# Patient Record
Sex: Male | Born: 1970 | Race: White | Hispanic: No | Marital: Married | Smoking: Never smoker
Health system: Southern US, Community
[De-identification: ages and names within clinical notes are randomized; demographics above are authoritative.]

## PROBLEM LIST (undated history)

## (undated) DIAGNOSIS — K5792 Diverticulitis of intestine, part unspecified, without perforation or abscess without bleeding: Secondary | ICD-10-CM

## (undated) DIAGNOSIS — K429 Umbilical hernia without obstruction or gangrene: Secondary | ICD-10-CM

## (undated) DIAGNOSIS — J302 Other seasonal allergic rhinitis: Secondary | ICD-10-CM

## (undated) DIAGNOSIS — J45909 Unspecified asthma, uncomplicated: Secondary | ICD-10-CM

## (undated) DIAGNOSIS — K469 Unspecified abdominal hernia without obstruction or gangrene: Secondary | ICD-10-CM

## (undated) DIAGNOSIS — I10 Essential (primary) hypertension: Secondary | ICD-10-CM

## (undated) DIAGNOSIS — K5790 Diverticulosis of intestine, part unspecified, without perforation or abscess without bleeding: Secondary | ICD-10-CM

## (undated) HISTORY — DX: Umbilical hernia without obstruction or gangrene: K42.9

## (undated) HISTORY — PX: HERNIA REPAIR: SHX51

## (undated) HISTORY — PX: KNEE ARTHROSCOPY: SUR90

## (undated) HISTORY — DX: Unspecified asthma, uncomplicated: J45.909

## (undated) HISTORY — PX: BACK SURGERY: SHX140

## (undated) HISTORY — DX: Diverticulitis of intestine, part unspecified, without perforation or abscess without bleeding: K57.92

## (undated) HISTORY — PX: KNEE SURGERY: SHX244

## (undated) HISTORY — DX: Unspecified abdominal hernia without obstruction or gangrene: K46.9

## (undated) HISTORY — DX: Diverticulosis of intestine, part unspecified, without perforation or abscess without bleeding: K57.90

---

## 1996-03-22 HISTORY — PX: FINGER AMPUTATION: SHX636

## 2013-08-27 ENCOUNTER — Encounter (INDEPENDENT_AMBULATORY_CARE_PROVIDER_SITE_OTHER): Payer: Self-pay | Admitting: Surgery

## 2013-08-27 ENCOUNTER — Ambulatory Visit (INDEPENDENT_AMBULATORY_CARE_PROVIDER_SITE_OTHER): Payer: Federal, State, Local not specified - PPO | Admitting: Surgery

## 2013-08-27 VITALS — BP 122/80 | HR 47 | Temp 98.4°F | Resp 12 | Ht 70.0 in | Wt 216.0 lb

## 2013-08-27 DIAGNOSIS — M6200 Separation of muscle (nontraumatic), unspecified site: Secondary | ICD-10-CM

## 2013-08-27 DIAGNOSIS — M6208 Separation of muscle (nontraumatic), other site: Secondary | ICD-10-CM | POA: Insufficient documentation

## 2013-08-27 DIAGNOSIS — K439 Ventral hernia without obstruction or gangrene: Secondary | ICD-10-CM

## 2013-08-27 NOTE — Progress Notes (Signed)
General Surgery [facility]  Chief Complaint  Patient presents with  . New Evaluation    evaluate umbilical hernia - referral from Dr. Juline Patch    HISTORY: Patient is a 43 year old male referred by his primary care physician for evaluation of ventral hernia. The patient and his wife noted this is been present for several years. It has gradually increased in size. A causes intermittent discomfort. It has always been reducible. Patient notes at least two areas that bulge and are reducible.    Patient has had no previous abdominal surgery with the exception of a left inguinal hernia repair performed as a child.  Past Medical History  Diagnosis Date  . Asthma     Current Outpatient Prescriptions  Medication Sig Dispense Refill  . DYMISTA 137-50 MCG/ACT SUSP       . fexofenadine (ALLEGRA) 180 MG tablet Take 180 mg by mouth daily.       No current facility-administered medications for this visit.    Allergies  Allergen Reactions  . Relafen [Nabumetone]     Family History  Problem Relation Age of Onset  . Cancer Mother     History   Social History  . Marital Status: Married    Spouse Name: N/A    Number of Children: N/A  . Years of Education: N/A   Social History Main Topics  . Smoking status: Never Smoker   . Smokeless tobacco: None  . Alcohol Use: Yes  . Drug Use: No  . Sexual Activity: None   Other Topics Concern  . None   Social History Narrative  . None    REVIEW OF SYSTEMS - PERTINENT POSITIVES ONLY: Denies signs or symptoms of obstruction. Intermittent discomfort. Always reducible.  EXAM: Filed Vitals:   08/27/13 0901  BP: 122/80  Pulse: 47  Temp: 98.4 F (36.9 C)  Resp: 12    GENERAL: well-developed, well-nourished, no acute distress HEENT: normocephalic; pupils equal and reactive; sclerae clear; dentition good; mucous membranes moist NECK:  symmetric on extension; no palpable anterior or posterior cervical  lymphadenopathy; no supraclavicular masses; no tenderness CHEST: clear to auscultation bilaterally without rales, rhonchi, or wheezes CARDIAC: regular rate and rhythm without significant murmur; peripheral pulses are full ABDOMEN: soft without distension; bowel sounds present; no mass; no hepatosplenomegaly; in a recumbent position there is a palpable fascial defect approximately 5-6 cm above the level of the umbilicus in the midline with a diameter of 3 cm; in a standing position there are 2 discrete fascial defects with the larger being the one described above and a smaller defect more cephalad and slightly to the right of midline; there is a rectus diastases of moderate size present. EXT:  non-tender without edema; no deformity NEURO: no gross focal deficits; no sign of tremor   LABORATORY RESULTS: See [facility] (CHL-Epic) for most recent results  RADIOLOGY RESULTS: See [facility] (CHL-Epic) for most recent results  IMPRESSION: #1 ventral herniae, multiple #2 rectus diastasis, moderate  PLAN: I had a discussion with the patient and his wife regarding these findings. We discussed the options for care, being open ventral hernia repair with mesh and concurrent repair of his rectus diastases versus performing laparoscopic repair of his ventral hernia without repair of the rectus diastases. We discussed the pros and cons of each procedure. We discussed the hospital stay and the postoperative recovery to be expected. I have recommended laparoscopic repair. The patient and his wife agree and wish to proceed.  The  risks and benefits of the procedure have been discussed at length with the patient.  The patient understands the proposed procedure, potential alternative treatments, and the course of recovery to be expected.  All of the patient's questions have been answered at this time.  The patient wishes to proceed with surgery.  Velora Hecklerodd M. Kaylan Friedmann, MD, FACS General & Endocrine  [facility]  Primary Care Physician: Juline PatchPANG,RICHARD, MD

## 2013-08-27 NOTE — Patient Instructions (Signed)
Laparoscopic Ventral Hernia Repair Laparoscopic ventral hernia repairis a surgery to fix a ventral hernia. Aventral hernia, also called an incisional hernia, is a bulge of body tissue or intestines that pushes through the front part of the abdomen. This can happen if the connective tissue covering the muscles over the abdomen has a weak spot or is torn because of a surgical cut (incision) from a previous surgery. Laparoscopic ventral hernia repair is often done soon after diagnosis to stop the hernia from getting bigger, becoming uncomfortable, or becoming an emergency. This surgery usually takes about 2 hours, but the time can vary greatly. LET YOUR CAREGIVER KNOW ABOUT:  Any allergies you have.  All medicines you are taking, including steroids, vitamins, herbs, eyedrops, and over-the-counter medicines and creams.  Previous problems you or members of your family have had with the use of anesthetics.  Any blood disorders or bleeding problems you have had.  Past surgeries you have had.  Other health problems you have. RISKS AND COMPLICATIONS  Generally, laparoscopic ventral hernia repair is a safe procedure. However, as with any surgical procedure, complications can occur. Possible complications include:  Bleeding.  Trouble passing urine or having a bowel movement after the operation.  Infection.  Pneumonia.  Blood clots.  Pain in the area of the hernia.  A bulge in the area of the hernia that may be caused by a collection of fluid.  Injury to intestines or other structures in the abdomen.  Return of the hernia after surgery. In some cases, the caregiver may need to stop the laparoscopic procedure and do regular, open surgery. This may be necessary for very difficult hernias, when organs are hard to see, or when bleeding problems occur during surgery. BEFORE THE PROCEDURE   You may need to have blood tests, urine tests, a chest X-ray, or electrocardiography done before the day  of the surgery.  Ask your caregiver about changing or stopping your regular medicines.  You may need to wash with a special type of germ-killing soap.  Do not eat or drink anything for at least 6 hours before the surgery.  Make plans to have someone drive you home after the procedure. PROCEDURE   Small monitors will be put on your body. They are used to check your heart, blood pressure, and oxygen level.  An intravenous (IV) access tube will be put into a vein in your hand or arm. Fluids and medicine will flow directly into your body through the IV tube.  You will be given medicine to make you sleep through the procedure (general anesthetic).  Your abdomen will be cleaned with a special soap to kill any germs on your skin.  Once you are asleep, several small incisions will be made in your abdomen.  The large space in your abdomen will be filled with air so that it expands. This gives the caregiver more room and a better view.  A thin, lighted tube with a tiny camera on the end (laparoscope) is put through a small incision in your abdomen. The camera on the laparoscope sends a picture to a TV screen in the operating room. This gives the caregiver a good view inside the abdomen.  Hollow tubes are put through the other small incisions in your abdomen. The tools needed for the procedure are put through these tubes.  The caregiver puts the tissue or intestines that formed the hernia back in place.  A screen-like patch (mesh) is used to close the hernia. This helps make   the area stronger. Stitches, tacks, or staples are used to keep the mesh in place.  Medicine and a bandage (dressing) or skin glue will be put over the incisions. AFTER THE PROCEDURE   You will stay in a recovery area until the anesthetic wears off. Your blood pressure and pulse will be checked often.  You may be able to go home the same day or may need to stay in the hospital for 1 or 2 days after this surgery. Your  caregiver will decide when you can go home.  You may feel some pain. You will likely be given medicine for pain.  You will be urged to do breathing exercises that involve taking deep breaths. This helps prevent a lung infection after a surgery.  You may have to wear compression stockings while you are in the hospital. These stockings help keep blood clots from forming in your legs. Document Released: 02/23/2012 Document Reviewed: 02/23/2012 Exit[employer] Patient Information 2014 [employer].  

## 2013-08-28 ENCOUNTER — Encounter (HOSPITAL_COMMUNITY): Payer: Self-pay | Admitting: Pharmacy Technician

## 2013-08-29 ENCOUNTER — Encounter (HOSPITAL_COMMUNITY): Payer: Self-pay

## 2013-08-29 ENCOUNTER — Encounter (HOSPITAL_COMMUNITY)
Admission: RE | Admit: 2013-08-29 | Discharge: 2013-08-29 | Disposition: A | Discharge status: Home / Self Care | Payer: Federal, State, Local not specified - PPO | Source: Ambulatory Visit | Attending: Surgery | Admitting: Surgery

## 2013-08-29 HISTORY — DX: Other seasonal allergic rhinitis: J30.2

## 2013-08-29 LAB — CBC
HCT: 45.7 % (ref 39.0–52.0)
Hemoglobin: 16 g/dL (ref 13.0–17.0)
MCH: 30.8 pg (ref 26.0–34.0)
MCHC: 35 g/dL (ref 30.0–36.0)
MCV: 88.1 fL (ref 78.0–100.0)
PLATELETS: 232 10*3/uL (ref 150–400)
RBC: 5.19 MIL/uL (ref 4.22–5.81)
RDW: 12.9 % (ref 11.5–15.5)
WBC: 9.9 10*3/uL (ref 4.0–10.5)

## 2013-08-29 NOTE — Patient Instructions (Signed)
Your procedure is scheduled on:  08/31/13  FRIDAY  Report to [facility]-- MAIN ENTRANCE- FOLLOW SIGNS TO SHORT STAY CENTER Short Stay Center at 0530      AM.   Call this number if you have problems the morning of surgery: [phone]        Do not eat food  Or drink :After Midnight. Thursday NIGHT   Take these medicines the morning of surgery with A SIP OF WATER:NONE   .  Contacts, dentures or partial plates, or metal hairpins  can not be worn to surgery. Your family will be responsible for glasses, dentures, hearing aides while you are in surgery  Leave suitcase in the car. After surgery it may be brought to your room.  For patients admitted to the hospital, checkout time is 11:00 AM day of  discharge.         [facility] IS NOT RESPONSIBLE FOR ANY VALUABLES                                                                 [place] - Preparing for Surgery Before surgery, you can play an important role.  Because skin is not sterile, your skin needs to be as free of germs as possible.  You can reduce the number of germs on your skin by washing with CHG (chlorahexidine gluconate) soap before surgery.  CHG is an antiseptic cleaner which kills germs and bonds with the skin to continue killing germs even after washing. Please DO NOT use if you have an allergy to CHG or antibacterial soaps.  If your skin becomes reddened/irritated stop using the CHG and inform your nurse when you arrive at Short Stay. Do not shave (including legs and underarms) for at least 48 hours prior to the first CHG shower.  You may shave your face/neck. Please follow these instructions carefully:  1.  Shower with CHG Soap the night before surgery and the  morning of Surgery.  2.  If you choose to wash your hair, wash your hair first as usual with your  normal  shampoo.  3.  After you shampoo, rinse your hair and body thoroughly to remove the  shampoo.                           4.  Use CHG as you would any  other liquid soap.  You can apply chg directly  to the skin and wash                       Gently with a scrungie or clean washcloth.  5.  Apply the CHG Soap to your body ONLY FROM THE NECK DOWN.   Do not use on face/ open                           Wound or open sores. Avoid contact with eyes, ears mouth and genitals (private parts).                       Wash face,  Genitals (private parts) with your normal soap.  6.  Wash thoroughly, paying special attention to the area where your surgery  will be performed.  7.  Thoroughly rinse your body with warm water from the neck down.  8.  DO NOT shower/wash with your normal soap after using and rinsing off  the CHG Soap.                9.  Pat yourself dry with a clean towel.            10.  Wear clean pajamas.            11.  Place clean sheets on your bed the night of your first shower and do not  sleep with pets. Day of Surgery : Do not apply any lotions/deodorants the morning of surgery.  Please wear clean clothes to the hospital/surgery center.  FAILURE TO FOLLOW THESE INSTRUCTIONS MAY RESULT IN THE CANCELLATION OF YOUR SURGERY PATIENT SIGNATURE_________________________________  NURSE SIGNATURE__________________________________  ________________________________________________________________________

## 2013-08-30 NOTE — Progress Notes (Signed)
Quick Note:  These results are acceptable for scheduled surgery.  Jesusmanuel Erbes M. Bryanah Sidell, MD, FACS [facility] Office: [phone]   ______ 

## 2013-08-31 ENCOUNTER — Encounter (HOSPITAL_COMMUNITY): Payer: Federal, State, Local not specified - PPO | Admitting: *Deleted

## 2013-08-31 ENCOUNTER — Encounter (HOSPITAL_COMMUNITY): Payer: Self-pay | Admitting: *Deleted

## 2013-08-31 ENCOUNTER — Observation Stay (HOSPITAL_COMMUNITY)
Admission: RE | Admit: 2013-08-31 | Discharge: 2013-09-01 | Disposition: A | Discharge status: Home / Self Care | Payer: Federal, State, Local not specified - PPO | Source: Ambulatory Visit | Attending: Surgery | Admitting: Surgery

## 2013-08-31 ENCOUNTER — Encounter (HOSPITAL_COMMUNITY)
Admission: RE | Disposition: A | Discharge status: Home / Self Care | Payer: Self-pay | Source: Ambulatory Visit | Attending: Surgery

## 2013-08-31 ENCOUNTER — Ambulatory Visit (HOSPITAL_COMMUNITY): Payer: Federal, State, Local not specified - PPO | Admitting: *Deleted

## 2013-08-31 DIAGNOSIS — K439 Ventral hernia without obstruction or gangrene: Principal | ICD-10-CM | POA: Insufficient documentation

## 2013-08-31 DIAGNOSIS — M6208 Separation of muscle (nontraumatic), other site: Secondary | ICD-10-CM | POA: Diagnosis present

## 2013-08-31 DIAGNOSIS — M6200 Separation of muscle (nontraumatic), unspecified site: Secondary | ICD-10-CM | POA: Insufficient documentation

## 2013-08-31 DIAGNOSIS — Z79899 Other long term (current) drug therapy: Secondary | ICD-10-CM | POA: Insufficient documentation

## 2013-08-31 DIAGNOSIS — Z01812 Encounter for preprocedural laboratory examination: Secondary | ICD-10-CM | POA: Insufficient documentation

## 2013-08-31 DIAGNOSIS — J45909 Unspecified asthma, uncomplicated: Secondary | ICD-10-CM | POA: Insufficient documentation

## 2013-08-31 HISTORY — PX: VENTRAL HERNIA REPAIR: SHX424

## 2013-08-31 HISTORY — PX: INSERTION OF MESH: SHX5868

## 2013-08-31 SURGERY — REPAIR, HERNIA, VENTRAL, LAPAROSCOPIC
Anesthesia: General | Site: Abdomen

## 2013-08-31 MED ORDER — 0.9 % SODIUM CHLORIDE (POUR BTL) OPTIME
TOPICAL | Status: DC | PRN
  Administered 2013-08-31: 1000 mL

## 2013-08-31 MED ORDER — MIDAZOLAM HCL 2 MG/2ML IJ SOLN
INTRAMUSCULAR | Status: AC
  Filled 2013-08-31: qty 2

## 2013-08-31 MED ORDER — CISATRACURIUM BESYLATE 20 MG/10ML IV SOLN
INTRAVENOUS | Status: AC
  Filled 2013-08-31: qty 10

## 2013-08-31 MED ORDER — DEXAMETHASONE SODIUM PHOSPHATE 10 MG/ML IJ SOLN
INTRAMUSCULAR | Status: AC
Start: 2013-08-31 — End: 2013-08-31
  Filled 2013-08-31: qty 1

## 2013-08-31 MED ORDER — PROPOFOL 10 MG/ML IV BOLUS
INTRAVENOUS | Status: DC | PRN
  Administered 2013-08-31: 200 mg via INTRAVENOUS

## 2013-08-31 MED ORDER — ACETAMINOPHEN 325 MG PO TABS: 650.0000 mg | ORAL_TABLET | ORAL | Status: DC | PRN

## 2013-08-31 MED ORDER — FENTANYL CITRATE 0.05 MG/ML IJ SOLN
25.0000 ug | INTRAMUSCULAR | Status: DC | PRN
  Administered 2013-08-31 (×2): 50 ug via INTRAVENOUS

## 2013-08-31 MED ORDER — ONDANSETRON HCL 4 MG/2ML IJ SOLN: 4.0000 mg | Freq: Four times a day (QID) | INTRAMUSCULAR | Status: DC | PRN

## 2013-08-31 MED ORDER — BUPIVACAINE-EPINEPHRINE 0.25% -1:200000 IJ SOLN
INTRAMUSCULAR | Status: DC | PRN
  Administered 2013-08-31: 20 mL

## 2013-08-31 MED ORDER — BUPIVACAINE-EPINEPHRINE (PF) 0.25% -1:200000 IJ SOLN
INTRAMUSCULAR | Status: AC
  Filled 2013-08-31: qty 30

## 2013-08-31 MED ORDER — KETAMINE HCL 10 MG/ML IJ SOLN
INTRAMUSCULAR | Status: AC
  Filled 2013-08-31: qty 1

## 2013-08-31 MED ORDER — HYDROMORPHONE HCL PF 1 MG/ML IJ SOLN
1.0000 mg | INTRAMUSCULAR | Status: DC | PRN
  Administered 2013-08-31 (×5): 1 mg via INTRAVENOUS
  Filled 2013-08-31 (×5): qty 1

## 2013-08-31 MED ORDER — ONDANSETRON HCL 4 MG/2ML IJ SOLN
INTRAMUSCULAR | Status: DC | PRN
Start: 2013-08-31 — End: 2013-08-31
  Administered 2013-08-31: 4 mg via INTRAVENOUS

## 2013-08-31 MED ORDER — PROMETHAZINE HCL 25 MG/ML IJ SOLN: 6.2500 mg | INTRAMUSCULAR | Status: DC | PRN

## 2013-08-31 MED ORDER — GLYCOPYRROLATE 0.2 MG/ML IJ SOLN
INTRAMUSCULAR | Status: AC
  Filled 2013-08-31: qty 3

## 2013-08-31 MED ORDER — CEFAZOLIN SODIUM-DEXTROSE 2-3 GM-% IV SOLR
INTRAVENOUS | Status: AC
  Filled 2013-08-31: qty 50

## 2013-08-31 MED ORDER — NEOSTIGMINE METHYLSULFATE 10 MG/10ML IV SOLN
INTRAVENOUS | Status: DC | PRN
  Administered 2013-08-31: 4 mg via INTRAVENOUS

## 2013-08-31 MED ORDER — CISATRACURIUM BESYLATE (PF) 10 MG/5ML IV SOLN
INTRAVENOUS | Status: DC | PRN
  Administered 2013-08-31: 6 mg via INTRAVENOUS

## 2013-08-31 MED ORDER — GLYCOPYRROLATE 0.2 MG/ML IJ SOLN
INTRAMUSCULAR | Status: AC
  Filled 2013-08-31: qty 1

## 2013-08-31 MED ORDER — KCL IN DEXTROSE-NACL 20-5-0.45 MEQ/L-%-% IV SOLN
INTRAVENOUS | Status: DC
  Administered 2013-08-31 (×2): via INTRAVENOUS
  Filled 2013-08-31 (×3): qty 1000

## 2013-08-31 MED ORDER — LACTATED RINGERS IV SOLN: INTRAVENOUS | Status: DC

## 2013-08-31 MED ORDER — ONDANSETRON HCL 4 MG/2ML IJ SOLN
INTRAMUSCULAR | Status: AC
  Filled 2013-08-31: qty 2

## 2013-08-31 MED ORDER — KCL IN DEXTROSE-NACL 20-5-0.45 MEQ/L-%-% IV SOLN
INTRAVENOUS | Status: AC
  Filled 2013-08-31: qty 1000

## 2013-08-31 MED ORDER — FENTANYL CITRATE 0.05 MG/ML IJ SOLN
INTRAMUSCULAR | Status: DC | PRN
  Administered 2013-08-31: 50 ug via INTRAVENOUS
  Administered 2013-08-31: 25 ug via INTRAVENOUS
  Administered 2013-08-31 (×5): 50 ug via INTRAVENOUS
  Administered 2013-08-31: 25 ug via INTRAVENOUS

## 2013-08-31 MED ORDER — PROPOFOL 10 MG/ML IV BOLUS
INTRAVENOUS | Status: AC
  Filled 2013-08-31: qty 20

## 2013-08-31 MED ORDER — LACTATED RINGERS IR SOLN
Status: DC | PRN
  Administered 2013-08-31: 1

## 2013-08-31 MED ORDER — METOCLOPRAMIDE HCL 5 MG/ML IJ SOLN
INTRAMUSCULAR | Status: AC
  Filled 2013-08-31: qty 2

## 2013-08-31 MED ORDER — NEOSTIGMINE METHYLSULFATE 10 MG/10ML IV SOLN
INTRAVENOUS | Status: AC
  Filled 2013-08-31: qty 1

## 2013-08-31 MED ORDER — SUCCINYLCHOLINE CHLORIDE 20 MG/ML IJ SOLN
INTRAMUSCULAR | Status: DC | PRN
  Administered 2013-08-31: 100 mg via INTRAVENOUS

## 2013-08-31 MED ORDER — ONDANSETRON HCL 4 MG PO TABS: 4.0000 mg | ORAL_TABLET | Freq: Four times a day (QID) | ORAL | Status: DC | PRN

## 2013-08-31 MED ORDER — HYDROMORPHONE HCL PF 2 MG/ML IJ SOLN
INTRAMUSCULAR | Status: AC
  Filled 2013-08-31: qty 1

## 2013-08-31 MED ORDER — EPHEDRINE SULFATE 50 MG/ML IJ SOLN
INTRAMUSCULAR | Status: DC | PRN
  Administered 2013-08-31 (×2): 5 mg via INTRAVENOUS

## 2013-08-31 MED ORDER — EPHEDRINE SULFATE 50 MG/ML IJ SOLN
INTRAMUSCULAR | Status: AC
  Filled 2013-08-31: qty 1

## 2013-08-31 MED ORDER — HYDROCODONE-ACETAMINOPHEN 5-325 MG PO TABS
1.0000 | ORAL_TABLET | ORAL | Status: DC | PRN
  Administered 2013-08-31 – 2013-09-01 (×3): 2 via ORAL
  Filled 2013-08-31 (×3): qty 2

## 2013-08-31 MED ORDER — LACTATED RINGERS IV SOLN
INTRAVENOUS | Status: DC | PRN
  Administered 2013-08-31 (×2): via INTRAVENOUS

## 2013-08-31 MED ORDER — CEFAZOLIN SODIUM-DEXTROSE 2-3 GM-% IV SOLR
2.0000 g | INTRAVENOUS | Status: AC
  Administered 2013-08-31: 2 g via INTRAVENOUS

## 2013-08-31 MED ORDER — MIDAZOLAM HCL 5 MG/5ML IJ SOLN
INTRAMUSCULAR | Status: DC | PRN
  Administered 2013-08-31: 2 mg via INTRAVENOUS

## 2013-08-31 MED ORDER — KETAMINE HCL 10 MG/ML IJ SOLN
INTRAMUSCULAR | Status: DC | PRN
  Administered 2013-08-31: 50 mg via INTRAVENOUS

## 2013-08-31 MED ORDER — FENTANYL CITRATE 0.05 MG/ML IJ SOLN
INTRAMUSCULAR | Status: AC
  Filled 2013-08-31: qty 5

## 2013-08-31 MED ORDER — GLYCOPYRROLATE 0.2 MG/ML IJ SOLN
INTRAMUSCULAR | Status: DC | PRN
  Administered 2013-08-31: .5 mg via INTRAVENOUS
  Administered 2013-08-31: 0.1 mg via INTRAVENOUS

## 2013-08-31 MED ORDER — MEPERIDINE HCL 50 MG/ML IJ SOLN: 6.2500 mg | INTRAMUSCULAR | Status: DC | PRN

## 2013-08-31 MED ORDER — FENTANYL CITRATE 0.05 MG/ML IJ SOLN
INTRAMUSCULAR | Status: AC
  Filled 2013-08-31: qty 2

## 2013-08-31 MED ORDER — METOCLOPRAMIDE HCL 5 MG/ML IJ SOLN
INTRAMUSCULAR | Status: DC | PRN
  Administered 2013-08-31: 10 mg via INTRAVENOUS

## 2013-08-31 SURGICAL SUPPLY — 38 items
BENZOIN TINCTURE PRP APPL 2/3 (GAUZE/BANDAGES/DRESSINGS) ×2 IMPLANT
BINDER ABDOMINAL 12 ML 46-62 (SOFTGOODS) ×2 IMPLANT
CANISTER SUCTION 2500CC (MISCELLANEOUS) IMPLANT
CHLORAPREP W/TINT 26ML (MISCELLANEOUS) ×2 IMPLANT
CLOSURE STERI-STRIP 1/4X4 (GAUZE/BANDAGES/DRESSINGS) ×2 IMPLANT
DECANTER SPIKE VIAL GLASS SM (MISCELLANEOUS) IMPLANT
DEVICE SECURE STRAP 25 ABSORB (INSTRUMENTS) ×4 IMPLANT
DEVICE TROCAR PUNCTURE CLOSURE (ENDOMECHANICALS) ×2 IMPLANT
DRAPE INCISE IOBAN 66X45 STRL (DRAPES) IMPLANT
DRAPE LAPAROSCOPIC ABDOMINAL (DRAPES) ×2 IMPLANT
DRAPE UTILITY XL STRL (DRAPES) ×2 IMPLANT
ELECT REM PT RETURN 9FT ADLT (ELECTROSURGICAL) ×2
ELECTRODE REM PT RTRN 9FT ADLT (ELECTROSURGICAL) ×1 IMPLANT
GAUZE SPONGE 2X2 8PLY STRL LF (GAUZE/BANDAGES/DRESSINGS) ×1 IMPLANT
GLOVE SURG ORTHO 8.0 STRL STRW (GLOVE) ×2 IMPLANT
GOWN STRL REUS W/TWL XL LVL3 (GOWN DISPOSABLE) ×4 IMPLANT
KIT BASIN OR (CUSTOM PROCEDURE TRAY) ×2 IMPLANT
MARKER SKIN DUAL TIP RULER LAB (MISCELLANEOUS) ×2 IMPLANT
MESH VENTRALIGHT ST 6X8 (Mesh Specialty) ×1 IMPLANT
MESH VENTRLGHT ELLIPSE 8X6XMFL (Mesh Specialty) ×1 IMPLANT
NEEDLE SPNL 22GX3.5 QUINCKE BK (NEEDLE) ×2 IMPLANT
SCISSORS LAP 5X35 DISP (ENDOMECHANICALS) IMPLANT
SET IRRIG TUBING LAPAROSCOPIC (IRRIGATION / IRRIGATOR) IMPLANT
SHEARS HARMONIC ACE PLUS 36CM (ENDOMECHANICALS) IMPLANT
SLEEVE XCEL OPT CAN 5 100 (ENDOMECHANICALS) ×8 IMPLANT
SOLUTION ANTI FOG 6CC (MISCELLANEOUS) ×2 IMPLANT
SPONGE GAUZE 2X2 STER 10/PKG (GAUZE/BANDAGES/DRESSINGS) ×1
STRIP CLOSURE SKIN 1/2X4 (GAUZE/BANDAGES/DRESSINGS) ×4 IMPLANT
SUT NOVA NAB DX-16 0-1 5-0 T12 (SUTURE) ×4 IMPLANT
TACKER 5MM HERNIA 3.5CML NAB (ENDOMECHANICALS) IMPLANT
TAPE CLOTH SOFT 2X10 (GAUZE/BANDAGES/DRESSINGS) ×2 IMPLANT
TOWEL OR 17X26 10 PK STRL BLUE (TOWEL DISPOSABLE) ×2 IMPLANT
TOWEL OR NON WOVEN STRL DISP B (DISPOSABLE) ×2 IMPLANT
TRAY FOLEY CATH 14FRSI W/METER (CATHETERS) ×2 IMPLANT
TRAY LAP CHOLE (CUSTOM PROCEDURE TRAY) ×2 IMPLANT
TROCAR XCEL NON-BLD 11X100MML (ENDOMECHANICALS) ×2 IMPLANT
TROCAR XCEL UNIV SLVE 11M 100M (ENDOMECHANICALS) ×2 IMPLANT
TUBING INSUFFLATION 10FT LAP (TUBING) ×2 IMPLANT

## 2013-08-31 NOTE — Anesthesia Preprocedure Evaluation (Addendum)
Anesthesia Evaluation  Patient identified by MRN, date of birth, ID band Patient awake    Reviewed: Allergy & Precautions, H&P , NPO status , Patient's Chart, lab work & pertinent test results  Airway Mallampati: II TM Distance: >3 FB Neck ROM: full    Dental no notable dental hx.    Pulmonary neg pulmonary ROS,  breath sounds clear to auscultation  Pulmonary exam normal       Cardiovascular Exercise Tolerance: Good negative cardio ROS  Rhythm:regular Rate:Normal     Neuro/Psych negative neurological ROS  negative psych ROS   GI/Hepatic negative GI ROS, Neg liver ROS,   Endo/Other  negative endocrine ROS  Renal/GU negative Renal ROS  negative genitourinary   Musculoskeletal   Abdominal   Peds  Hematology negative hematology ROS (+)   Anesthesia Other Findings   Reproductive/Obstetrics negative OB ROS                           Anesthesia Physical Anesthesia Plan  ASA: I  Anesthesia Plan: General   Post-op Pain Management:    Induction:   Airway Management Planned: Oral ETT and LMA  Additional Equipment:   Intra-op Plan:   Post-operative Plan:   Informed Consent: I have reviewed the patients History and Physical, chart, labs and discussed the procedure including the risks, benefits and alternatives for the proposed anesthesia with the patient or authorized representative who has indicated his/her understanding and acceptance.   Dental Advisory Given  Plan Discussed with: CRNA  Anesthesia Plan Comments:        Anesthesia Quick Evaluation

## 2013-08-31 NOTE — Op Note (Signed)
NAMPilar Grammes:  Mccuistion, Buren               ACCOUNT NO.:  [id number]  MEDICAL RECORD NO.:  [id number]  LOCATION:  [facility]                         [facility]  PHYSICIAN:  Velora Hecklerodd M Jerri Glauser, MD      DATE OF BIRTH:  14-Jun-1970  DATE OF PROCEDURE:  08/31/2013                               OPERATIVE REPORT   PREOPERATIVE DIAGNOSIS:  Ventral hernia.  POSTOPERATIVE DIAGNOSIS:  Ventral hernia.  PROCEDURE:  Laparoscopic ventral hernia repair with mesh (15 cm x 20 cm Ventralex).  SURGEON:  Velora Hecklerodd M. Pedro Whiters, MD, FACS  ANESTHESIA:  General.  ESTIMATED BLOOD LOSS:  Minimal.  PREPARATION:  ChloraPrep.  COMPLICATIONS:  None.  INDICATIONS:  The patient is a 43 year old male referred by Dr. Juline Patchichard Pang for evaluation of ventral hernia.  The patient and his wife had noted the hernia for several years.  It had gradually increased in size. It causes intermittent discomfort.  It has always been reducible.  There are at least 2 fascial defects identified on physical exam.  The patient now comes to Surgery for laparoscopic repair.  BODY OF REPORT:  [facility]  The patient was brought to the operating room, placed in supine position on the operating room table.  Following administration of general anesthesia, the patient was positioned and then prepped and draped in the usual aseptic fashion.  After ascertaining that an adequate level of anesthesia had been achieved, an incision was made at the left costal margin in the left upper quadrant of the abdominal wall.  Using a 5-mm Optiview trocar and the laparoscoped the peritoneal cavity was accessed and then insufflated with carbon dioxide.  An additional operative port was placed in the left lower quadrant with 11-mm trocar.  There were no significant adhesions to the anterior abdominal wall.  The falciform ligament extends to nearly one of the hernia defects.  It was taken down using the  electrocautery and sharp dissection.  The area around each hernia defect was cleared sharply and hemostasis achieved with the electrocautery.  The initial largest defect was approximately 3 cm in diameter, and was located in the midline approximately 6 cm above the umbilicus.  There was a second 1.5 cm defect also merely in the midline approximately 4 cm cephalad to the first larger defect.  Measurements were taken, and it appeared that a 20 cm x 15 cm mesh will cover both defects with a broad 5-cm overlap.  Inspection of the remaining midline showed no other hernia defects, particularly around the area of the umbilicus.  Mesh was prepared with interrupted #1 Novafil sutures placed circumferentially at 8 equally spaced points around the mesh.  Mesh was then moistened, rolled, and inserted under direct vision into the peritoneal cavity.  It was appropriately oriented.  Two additional 5-mm operative ports were placed in the right upper quadrant and left lower quadrant.  Using the EndoCatch, the suture tags were retrieved through the abdominal wall.  The sutures were then pulled taut to elevate the mesh against the abdominal wall with clearly wide overlap of the hernia defects.  Each of the #1 Novafil sutures was then tied securely.  Using the SecureStrap tacking device, the mesh was then affixed to the anterior abdominal wall with a closely spaced row of SecureStraps around the periphery and a second concentric circle placed more centrally on the mesh.  At the conclusion, there was good approximation of the mesh circumferentially and no areas of laxity were identified.  Good hemostasis was noted.  Ports were removed under direct vision. Hemostasis was noted at all port sites.  Pneumoperitoneum was released. Port sites were anesthetized with local anesthetic and closed with interrupted 4-0 Monocryl simple sutures.  All wounds were washed and dried.  Benzoin and Steri-Strips were applied  to all wounds.  Dry gauze dressings were applied to all wounds.  A 12-inch abdominal binder was placed around the torso upon transfer off the operating room table.  The patient was awakened from anesthesia and brought to the recovery room in stable condition.  The patient tolerated the procedure well.   [facility] Hecklerodd M. Ollie Delano, MD, [facility] Office: [phone]    TMG/MEDQ  D:  08/31/2013  T:  08/31/2013  Job:  [id number]  cc:   Juline Patchichard Pang, M.D. Fax: [phone]

## 2013-08-31 NOTE — Anesthesia Postprocedure Evaluation (Signed)
  Anesthesia Post-op Note  Patient: Kenneth Krause  Procedure(s) Performed: Procedure(s) (LRB): LAPAROSCOPIC VENTRAL HERNIA (N/A) INSERTION OF MESH (N/A)  Patient Location: PACU  Anesthesia Type: General  Level of Consciousness: awake and alert   Airway and Oxygen Therapy: Patient Spontanous Breathing  Post-op Pain: mild  Post-op Assessment: Post-op Vital signs reviewed, Patient's Cardiovascular Status Stable, Respiratory Function Stable, Patent Airway and No signs of Nausea or vomiting  Last Vitals:  Filed Vitals:   08/31/13 1330  BP: 127/76  Pulse: 60  Temp: 36.3 C  Resp: 12    Post-op Vital Signs: stable   Complications: No apparent anesthesia complications

## 2013-08-31 NOTE — Interval H&P Note (Signed)
History and Physical Interval Note:  08/31/2013 7:18 AM  Pilar Grammesimothy Labrum  has presented today for surgery, with the diagnosis of ventral hernia.  The various methods of treatment have been discussed with the patient and family. After consideration of risks, benefits and other options for treatment, the patient has consented to    Procedure(s): LAPAROSCOPIC VENTRAL HERNIA (N/A) INSERTION OF MESH (N/A) as a surgical intervention .    The patient's history has been reviewed, patient examined, no change in status, stable for surgery.  I have reviewed the patient's chart and labs.  Questions were answered to the patient's satisfaction.    [facility] Hecklerodd M. Donelle Baba, MD, [facility] Office: [phone]    Timothey Dahlstrom MJudie Petit

## 2013-08-31 NOTE — Brief Op Note (Signed)
08/31/2013  8:58 AM  PATIENT:  Kenneth Krause  43 y.o. male  PRE-OPERATIVE DIAGNOSIS:  ventral hernia  POST-OPERATIVE DIAGNOSIS:  same  PROCEDURE:  Procedure(s): LAPAROSCOPIC VENTRAL HERNIA (N/A) INSERTION OF MESH (N/A)  SURGEON:  Surgeon(s) and Role:    * Vel[facility] Hecklerodd M Nehan Flaum, MD - Primary  ANESTHESIA:   general  EBL:  Total I/O In: -  Out: 150 [Urine:150]  BLOOD ADMINISTERED:none  DRAINS: none   LOCAL MEDICATIONS USED:  MARCAINE     SPECIMEN:  No Specimen  DISPOSITION OF SPECIMEN:  N/A  COUNTS:  YES  TOURNIQUET:  * No tourniquets in log *  DICTATION: .Other Dictation: Dictation Number [id number]  PLAN OF CARE: Admit for overnight observation  PATIENT DISPOSITION:  PACU - hemodynamically stable.   Delay start of Pharmacological VTE agent (>24hrs) due to surgical blood loss or risk of bleeding: yes  Vel[facility] Hecklerodd M. Chibueze Beasley, MD, [facility] Office: [phone]

## 2013-08-31 NOTE — H&P (View-Only) (Signed)
General Surgery - [facility]  Chief Complaint  Patient presents with  . New Evaluation    evaluate umbilical hernia - referral from Dr. Richard Pang    HISTORY: Patient is a 42-year-old male referred by his primary care physician for evaluation of ventral hernia. The patient and his wife noted this is been present for several years. It has gradually increased in size. A causes intermittent discomfort. It has always been reducible. Patient notes at least two areas that bulge and are reducible.    Patient has had no previous abdominal surgery with the exception of a left inguinal hernia repair performed as a child.  Past Medical History  Diagnosis Date  . Asthma     Current Outpatient Prescriptions  Medication Sig Dispense Refill  . DYMISTA 137-50 MCG/ACT SUSP       . fexofenadine (ALLEGRA) 180 MG tablet Take 180 mg by mouth daily.       No current facility-administered medications for this visit.    Allergies  Allergen Reactions  . Relafen [Nabumetone]     Family History  Problem Relation Age of Onset  . Cancer Mother     History   Social History  . Marital Status: Married    Spouse Name: N/A    Number of Children: N/A  . Years of Education: N/A   Social History Main Topics  . Smoking status: Never Smoker   . Smokeless tobacco: None  . Alcohol Use: Yes  . Drug Use: No  . Sexual Activity: None   Other Topics Concern  . None   Social History Narrative  . None    REVIEW OF SYSTEMS - PERTINENT POSITIVES ONLY: Denies signs or symptoms of obstruction. Intermittent discomfort. Always reducible.  EXAM: Filed Vitals:   08/27/13 0901  BP: 122/80  Pulse: 47  Temp: 98.4 F (36.9 C)  Resp: 12    GENERAL: well-developed, well-nourished, no acute distress HEENT: normocephalic; pupils equal and reactive; sclerae clear; dentition good; mucous membranes moist NECK:  symmetric on extension; no palpable anterior or posterior cervical  lymphadenopathy; no supraclavicular masses; no tenderness CHEST: clear to auscultation bilaterally without rales, rhonchi, or wheezes CARDIAC: regular rate and rhythm without significant murmur; peripheral pulses are full ABDOMEN: soft without distension; bowel sounds present; no mass; no hepatosplenomegaly; in a recumbent position there is a palpable fascial defect approximately 5-6 cm above the level of the umbilicus in the midline with a diameter of 3 cm; in a standing position there are 2 discrete fascial defects with the larger being the one described above and a smaller defect more cephalad and slightly to the right of midline; there is a rectus diastases of moderate size present. EXT:  non-tender without edema; no deformity NEURO: no gross focal deficits; no sign of tremor   LABORATORY RESULTS: See [facility] ([facility]L-Epic) for most recent results  RADIOLOGY RESULTS: See [facility] (CHL-Epic) for most recent results  IMPRESSION: #1 ventral herniae, multiple #2 rectus diastasis, moderate  PLAN: I had a discussion with the patient and his wife regarding these findings. We discussed the options for care, being open ventral hernia repair with mesh and concurrent repair of his rectus diastases versus performing laparoscopic repair of his ventral hernia without repair of the rectus diastases. We discussed the pros and cons of each procedure. We discussed the hospital stay and the postoperative recovery to be expected. I have recommended laparoscopic repair. The patient and his wife agree and wish to proceed.  The   risks and benefits of the procedure have been discussed at length with the patient.  The patient understands the proposed procedure, potential alternative treatments, and the course of recovery to be expected.  All of the patient's questions have been answered at this time.  The patient wishes to proceed with surgery.  Velora Hecklerodd M. Demetrias Goodbar, MD, FACS General & Endocrine  [facility]gery [facility]  Primary Care Physician: Juline PatchPANG,RICHARD, MD

## 2013-08-31 NOTE — Transfer of Care (Signed)
Immediate Anesthesia Transfer of Care Note  Patient: Kenneth Krause  Procedure(s) Performed: Procedure(s): LAPAROSCOPIC VENTRAL HERNIA (N/A) INSERTION OF MESH (N/A)  Patient Location: PACU  Anesthesia Type:General  Level of Consciousness: Patient easily awoken, sedated, comfortable, cooperative, following commands, responds to stimulation.   Airway & Oxygen Therapy: Patient spontaneously breathing, ventilating well, oxygen via simple oxygen mask.  Post-op Assessment: Report given to PACU RN, vital signs reviewed and stable, moving all extremities.   Post vital signs: Reviewed and stable.  Complications: No apparent anesthesia complications

## 2013-09-01 MED ORDER — HYDROCODONE-ACETAMINOPHEN 5-325 MG PO TABS: 1.0000 | ORAL_TABLET | ORAL | Status: DC | PRN

## 2013-09-01 NOTE — Discharge Instructions (Signed)
°  [facility]  LAPAROSCOPIC SURGERY - POST-OP INSTRUCTIONS  Always review your discharge instruction sheet given to you by the facility where your surgery was performed.  A prescription for pain medication may be given to you upon discharge.  Take your pain medication as prescribed.  If narcotic pain medicine is not needed, then you may take acetaminophen (Tylenol) or ibuprofen (Advil) as needed.  Take your usually prescribed medications unless otherwise directed.  If you need a refill on your pain medication, please contact your pharmacy.  They will contact our office to request authorization. Prescriptions will not be filled after 5 P.M. or on weekends.  You should follow a light diet the first few days after arrival home, such as soup and crackers or toast.  Be sure to include plenty of fluids daily.  Most patients will experience some swelling and bruising in the area of the incisions.  Ice packs will help.  Swelling and bruising can take several days to resolve.   It is common to experience some constipation if taking pain medication after surgery.  Increasing fluid intake and taking a stool softener (such as Colace) will usually help or prevent this problem from occurring.  A mild laxative (Milk of Magnesia or Miralax) should be taken according to package instructions if there are no bowel movements after 48 hours.  Wear abdominal binder at all times during first 3 weeks after surgery except to shower.  You may wash the binder if needed.  Unless discharge instructions indicate otherwise, you may remove your bandages 24-48 hours after surgery, and you may shower at that time.  You may have steri-strips (small skin tapes) in place directly over the incision.  These strips should be left on the skin for 7-10 days.  Any sutures or staples will be removed at the office during your follow-up visit.  ACTIVITIES:  You may resume regular (light) daily activities beginning the  next day--such as daily self-care, walking, climbing stairs--gradually increasing activities as tolerated.  You may have sexual intercourse when it is comfortable.  Refrain from any heavy lifting or straining until approved by your doctor.  You may drive when you are no longer taking prescription pain medication, you can comfortably wear a seatbelt, and you can safely maneuver your car and apply brakes.  You should see your doctor in the office for a follow-up appointment approximately 2-3 weeks after your surgery.  Make sure that you call for this appointment within a day or two after you arrive home to insure a convenient appointment time.  WHEN TO CALL YOUR DOCTOR: 1. Fever over 101.0 2. Inability to urinate 3. Continued bleeding from incision 4. Increased pain, redness, or drainage from the incision 5. Increasing abdominal pain  The clinic staff is available to answer your questions during regular business hours.  Please dont hesitate to call and ask to speak to one of the nurses for clinical concerns.  If you have a medical emergency, go to the nearest emergency room or call 911.  [facility] is always on call for the hospital.  Velora Hecklerodd M. Trissa Molina, MD, [facility] Office: [phone] Toll Free:  [phone] FAX [phone]  Web site: [email or web address]

## 2013-09-01 NOTE — Discharge Summary (Signed)
  Physician Discharge Summary [facility]  Patient ID: Kenneth Krause MRN: [id number] DOB/AGE: 1970/04/06 43 y.o.  Admit date: 08/31/2013 Discharge date: 09/01/2013  Admission Diagnoses:  Ventral herniae  Discharge Diagnoses:  Principal Problem:   Ventral hernia, multiple Active Problems:   Rectus diastasis   Discharged Condition: good  Hospital Course: patient admitted for observation after lap ventral hernia repair with mesh.  Post op course uncomplicated.  Ambulatory. Tolerating regular diet. Pain controlled.  Prepared for discharge on POD#1.  Consults: None  Treatments: surgery:  lap ventral hernia repair with mesh  Discharge Exam: Blood pressure 129/72, pulse 61, temperature 97.9 F (36.6 C), temperature source Oral, resp. rate 16, height 5\' 10"  (1.778 m), weight 215 lb (97.523 kg), SpO2 97.00%. HEENT - clear Neck - soft Chest - clear bilaterally Cor - RRR Abd - soft without distension; dressings dry and intact; binder replaced Ext - non-tender without edema  Disposition: Home with family     Medication List    ASK your doctor about these medications       acetaminophen 500 MG tablet  Commonly known as:  TYLENOL  Take 1,000 mg by mouth every 6 (six) hours as needed for mild pain.     DYMISTA 137-50 MCG/ACT Susp  Generic drug:  Azelastine-Fluticasone  Place 1 puff into both nostrils 2 (two) times daily.     fexofenadine 180 MG tablet  Commonly known as:  ALLEGRA  Take 180 mg by mouth daily.     ibuprofen 200 MG tablet  Commonly known as:  ADVIL,MOTRIN  Take 400 mg by mouth every 6 (six) hours as needed for mild pain.     phenylephrine 10 MG Tabs tablet  Commonly known as:  SUDAFED PE  Take 10 mg by mouth every 4 (four) hours as needed (congestion).         Velora Hecklerodd M. Sharalyn Lomba, MD, [facility] Office: [phone]   Signed: Velora HecklerGERKIN,Jo Booze M 09/01/2013, 7:51 AM

## 2013-09-01 NOTE — Progress Notes (Signed)
Discharge instructions reviewed utilizing teach back method with patient and wife. No questions at this time. Patient discharged to home.

## 2013-09-04 ENCOUNTER — Encounter (HOSPITAL_COMMUNITY): Payer: Self-pay | Admitting: Surgery

## 2013-09-25 ENCOUNTER — Encounter (INDEPENDENT_AMBULATORY_CARE_PROVIDER_SITE_OTHER): Payer: Self-pay | Admitting: Surgery

## 2013-09-25 ENCOUNTER — Ambulatory Visit (INDEPENDENT_AMBULATORY_CARE_PROVIDER_SITE_OTHER): Payer: Federal, State, Local not specified - PPO | Admitting: Surgery

## 2013-09-25 VITALS — BP 124/86 | HR 56 | Temp 97.8°F | Resp 16 | Ht 70.0 in | Wt 214.2 lb

## 2013-09-25 DIAGNOSIS — M6200 Separation of muscle (nontraumatic), unspecified site: Secondary | ICD-10-CM

## 2013-09-25 DIAGNOSIS — K439 Ventral hernia without obstruction or gangrene: Secondary | ICD-10-CM

## 2013-09-25 DIAGNOSIS — M6208 Separation of muscle (nontraumatic), other site: Secondary | ICD-10-CM

## 2013-09-25 NOTE — Patient Instructions (Signed)
  CARE OF INCISION   Apply cocoa butter/vitamin E cream (Palmer's brand) to your incision 2 - 3 times daily.  Massage cream into incision for one minute with each application.  Use sunscreen (50 SPF or higher) for first 6 months after surgery if area is exposed to sun.  You may alternate Mederma or other scar reducing cream with cocoa butter cream if desired.       Justyn Langham M. Oria Klimas, MD, FACS      [facility]      Office: [phone]    

## 2013-09-25 NOTE — Progress Notes (Signed)
General Surgery [facility]- [facility]  Chief Complaint  Patient presents with  . Routine Post Op    ventral hernia repair 08/31/2013    HISTORY: Patient is a 43 year old male who underwent laparoscopic ventral hernia repair with mesh on 08/31/2013. Postoperative course has been largely uneventful. He has had some intermittent sharp discomfort at the umbilicus. Otherwise he has been doing well.  EXAM: Incisions are well-healed. No sign of infection. Moderate sized seroma at site of previous hernia sac. With Valsalva and cough in a standing position there is no evidence of recurrence.  IMPRESSION: Status post laparoscopic ventral hernia repair with mesh  PLAN: Patient will begin resuming normal activity. I've asked him to restrict his lifting to 25-30 pounds. He will wear the abdominal binder during the daytime but may take it off in the evenings at night.  Patient will return in 6 weeks for final wound check.  Velora Hecklerodd M. Seleny Allbright, MD, FACS General & Endocrine Surgery [facility]   Visit Diagnoses: 1. Ventral hernia without obstruction or gangrene   2. Rectus diastasis

## 2013-11-07 ENCOUNTER — Encounter (INDEPENDENT_AMBULATORY_CARE_PROVIDER_SITE_OTHER): Payer: Federal, State, Local not specified - PPO | Admitting: Surgery

## 2013-12-03 ENCOUNTER — Encounter (INDEPENDENT_AMBULATORY_CARE_PROVIDER_SITE_OTHER): Payer: Federal, State, Local not specified - PPO | Admitting: Surgery

## 2015-08-13 DIAGNOSIS — Z0000 Encounter for general adult medical examination without abnormal findings: Secondary | ICD-10-CM | POA: Diagnosis not present

## 2015-08-20 DIAGNOSIS — J309 Allergic rhinitis, unspecified: Secondary | ICD-10-CM | POA: Diagnosis not present

## 2015-08-20 DIAGNOSIS — Z0000 Encounter for general adult medical examination without abnormal findings: Secondary | ICD-10-CM | POA: Diagnosis not present

## 2015-08-20 DIAGNOSIS — Z6830 Body mass index (BMI) 30.0-30.9, adult: Secondary | ICD-10-CM | POA: Diagnosis not present

## 2015-08-29 DIAGNOSIS — H11421 Conjunctival edema, right eye: Secondary | ICD-10-CM | POA: Diagnosis not present

## 2015-08-29 DIAGNOSIS — H1131 Conjunctival hemorrhage, right eye: Secondary | ICD-10-CM | POA: Diagnosis not present

## 2015-09-01 DIAGNOSIS — S0501XA Injury of conjunctiva and corneal abrasion without foreign body, right eye, initial encounter: Secondary | ICD-10-CM | POA: Diagnosis not present

## 2015-09-20 DIAGNOSIS — K5792 Diverticulitis of intestine, part unspecified, without perforation or abscess without bleeding: Secondary | ICD-10-CM

## 2015-09-20 HISTORY — DX: Diverticulitis of intestine, part unspecified, without perforation or abscess without bleeding: K57.92

## 2015-09-29 ENCOUNTER — Other Ambulatory Visit: Payer: Self-pay | Admitting: Internal Medicine

## 2015-09-29 ENCOUNTER — Ambulatory Visit
Admission: RE | Admit: 2015-09-29 | Discharge: 2015-09-29 | Disposition: A | Discharge status: Home / Self Care | Payer: Federal, State, Local not specified - PPO | Source: Ambulatory Visit | Attending: Internal Medicine | Admitting: Internal Medicine

## 2015-09-29 DIAGNOSIS — R1032 Left lower quadrant pain: Secondary | ICD-10-CM

## 2015-09-29 MED ORDER — IOPAMIDOL (ISOVUE-300) INJECTION 61%
125.0000 mL | Freq: Once | INTRAVENOUS | Status: AC | PRN
  Administered 2015-09-29: 125 mL via INTRAVENOUS

## 2015-10-01 DIAGNOSIS — K529 Noninfective gastroenteritis and colitis, unspecified: Secondary | ICD-10-CM | POA: Diagnosis not present

## 2015-10-01 DIAGNOSIS — K5792 Diverticulitis of intestine, part unspecified, without perforation or abscess without bleeding: Secondary | ICD-10-CM | POA: Diagnosis not present

## 2015-10-02 ENCOUNTER — Encounter: Payer: Self-pay | Admitting: Internal Medicine

## 2015-12-15 ENCOUNTER — Encounter: Payer: Self-pay | Admitting: Internal Medicine

## 2015-12-15 ENCOUNTER — Ambulatory Visit (INDEPENDENT_AMBULATORY_CARE_PROVIDER_SITE_OTHER): Payer: Federal, State, Local not specified - PPO | Admitting: Internal Medicine

## 2015-12-15 VITALS — BP 128/78 | HR 64 | Ht 70.0 in | Wt 216.0 lb

## 2015-12-15 DIAGNOSIS — K5732 Diverticulitis of large intestine without perforation or abscess without bleeding: Secondary | ICD-10-CM | POA: Diagnosis not present

## 2015-12-15 NOTE — Progress Notes (Signed)
   Kenneth Krause 45 y.o. 04-Oct-1970 [id number]  Assessment & Plan:   1. Diverticulitis of colon     I reviewed his situation, he feels completely well and had CT findings consistent with diverticulitis or resolving diverticulitis versus epiploic appendagitis. I have explained how it is very rare but a real possibility of a polyp or cancer serving as the trigger for this. This has been looked at in the literature though it is incredibly rare finding. He understands a very small risk of missing a more significant lesion like a colon cancer but has decided not to pursue a colonoscopy at this time. Overall I index of suspicion of a serious problem is very low here. Should he have recurrent problems we can readdress. Otherwise would anticipate a routine screening colonoscopy or other screening colon cancer testing at age 45 approximately. I do think he probably had diverticulitis based upon the clinical course and resolution with antibiotics. I appreciate the opportunity to care for this patient. 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 He developed left lower quadrant pain for about 3 days back in July, he was started on antibiotics and a CT scan of the abdomen and pelvis on July 10 demonstrated pericolic inflammation in a focal segment of the lower descending colon thought most likely to be sequelae of acute diverticulitis. He had never had these problems before, after the antibiotics he had complete resolution of the symptoms and they have not recurred. There is no change in bowel habits or rectal bleeding or other passage of blood or any other GI problems reported. Her is no clear  family history of colon cancer, his mother had metastatic lung cancer in his father was not really known to the patient to well apparently had some sort of a cancer that he died from in his 5260s.  Medications, allergies, past medical history, past surgical history, family history and social history are reviewed and updated in the EMR.  Review of Systems All other review of systems negative  Objective:   Physical Exam BP 128/78 (BP Location: Left Arm, Patient Position: Sitting, Cuff Size: Large)   Pulse 64   Ht 5\' 10"  (1.778 m)   Wt 216 lb (98 kg)   SpO2 98%   BMI 30.99 kg/m  Eyes anicteric Abdomen is soft without tenderness or organomegaly or mass He is alert and oriented 3  I reviewed the CT scan report and images. Hemoglobin was 16.3 on July 10

## 2015-12-15 NOTE — Patient Instructions (Signed)
   I hope things go well but if you have recurrent problems or any questions let me know. If you decide to pursue a colonoscopy also call back.  Otherwise will plan for a screening colonoscopy test when you are 49.   I appreciate the opportunity to care for you. Iva Booparl E. Tlaloc Taddei, MD, Clementeen GrahamFACG

## 2015-12-16 ENCOUNTER — Encounter: Payer: Self-pay | Admitting: Internal Medicine

## 2016-02-20 DIAGNOSIS — R7303 Prediabetes: Secondary | ICD-10-CM | POA: Diagnosis not present

## 2016-02-20 DIAGNOSIS — Z23 Encounter for immunization: Secondary | ICD-10-CM | POA: Diagnosis not present

## 2016-02-20 DIAGNOSIS — L738 Other specified follicular disorders: Secondary | ICD-10-CM | POA: Diagnosis not present

## 2016-03-19 ENCOUNTER — Other Ambulatory Visit: Payer: Self-pay | Admitting: Internal Medicine

## 2016-03-19 ENCOUNTER — Ambulatory Visit
Admission: RE | Admit: 2016-03-19 | Discharge: 2016-03-19 | Disposition: A | Discharge status: Home / Self Care | Payer: Federal, State, Local not specified - PPO | Source: Ambulatory Visit | Attending: Internal Medicine | Admitting: Internal Medicine

## 2016-03-19 DIAGNOSIS — G44301 Post-traumatic headache, unspecified, intractable: Secondary | ICD-10-CM

## 2016-03-19 DIAGNOSIS — R51 Headache: Secondary | ICD-10-CM | POA: Diagnosis not present

## 2016-08-27 DIAGNOSIS — Z0000 Encounter for general adult medical examination without abnormal findings: Secondary | ICD-10-CM | POA: Diagnosis not present

## 2016-09-01 DIAGNOSIS — Z1212 Encounter for screening for malignant neoplasm of rectum: Secondary | ICD-10-CM | POA: Diagnosis not present

## 2016-09-01 DIAGNOSIS — D487 Neoplasm of uncertain behavior of other specified sites: Secondary | ICD-10-CM | POA: Diagnosis not present

## 2016-09-01 DIAGNOSIS — Z0000 Encounter for general adult medical examination without abnormal findings: Secondary | ICD-10-CM | POA: Diagnosis not present

## 2016-11-08 DIAGNOSIS — D170 Benign lipomatous neoplasm of skin and subcutaneous tissue of head, face and neck: Secondary | ICD-10-CM | POA: Diagnosis not present

## 2017-01-27 DIAGNOSIS — S83242A Other tear of medial meniscus, current injury, left knee, initial encounter: Secondary | ICD-10-CM | POA: Diagnosis not present

## 2017-02-19 HISTORY — PX: KNEE SURGERY: SHX244

## 2017-03-02 DIAGNOSIS — M25562 Pain in left knee: Secondary | ICD-10-CM | POA: Diagnosis not present

## 2017-03-08 DIAGNOSIS — G8918 Other acute postprocedural pain: Secondary | ICD-10-CM | POA: Diagnosis not present

## 2017-03-08 DIAGNOSIS — Y999 Unspecified external cause status: Secondary | ICD-10-CM | POA: Diagnosis not present

## 2017-03-08 DIAGNOSIS — S83242A Other tear of medial meniscus, current injury, left knee, initial encounter: Secondary | ICD-10-CM | POA: Diagnosis not present

## 2017-03-08 DIAGNOSIS — S83232A Complex tear of medial meniscus, current injury, left knee, initial encounter: Secondary | ICD-10-CM | POA: Diagnosis not present

## 2017-03-08 DIAGNOSIS — S83282A Other tear of lateral meniscus, current injury, left knee, initial encounter: Secondary | ICD-10-CM | POA: Diagnosis not present

## 2017-03-08 DIAGNOSIS — M94262 Chondromalacia, left knee: Secondary | ICD-10-CM | POA: Diagnosis not present

## 2017-03-14 DIAGNOSIS — S83282D Other tear of lateral meniscus, current injury, left knee, subsequent encounter: Secondary | ICD-10-CM | POA: Diagnosis not present

## 2017-03-14 DIAGNOSIS — S83242D Other tear of medial meniscus, current injury, left knee, subsequent encounter: Secondary | ICD-10-CM | POA: Diagnosis not present

## 2017-04-04 DIAGNOSIS — S83242D Other tear of medial meniscus, current injury, left knee, subsequent encounter: Secondary | ICD-10-CM | POA: Diagnosis not present

## 2017-05-02 IMAGING — CT CT HEAD W/O CM
3 of 4 series · 17 of 47 positions shown, 20 images · non-contrast
Comparison: None.

CLINICAL DATA: Hit head on cabinet Thanksgiving weekend. Persistent
pain at lump.

EXAM:
CT HEAD WITHOUT CONTRAST
TECHNIQUE: Contiguous axial images were obtained from the base of the skull
through the vertex without intravenous contrast.

[Series 32: 3d filtered head w/o · axial · non-contrast · 0.43mm/px · z∈[-20,+110]mm · 11 of 32 slices shown, 14 images]
[im 3/32  brain]
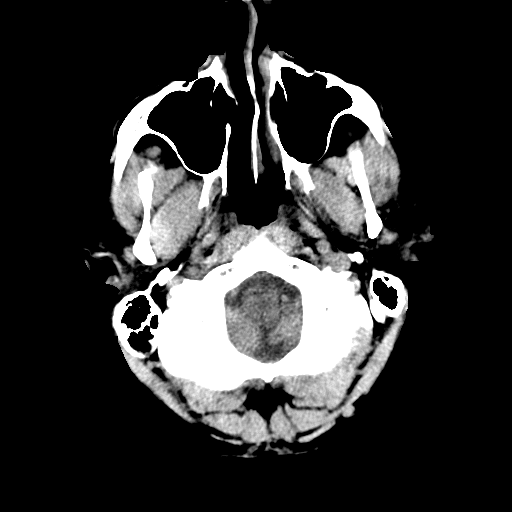
[im 3/32  bone]
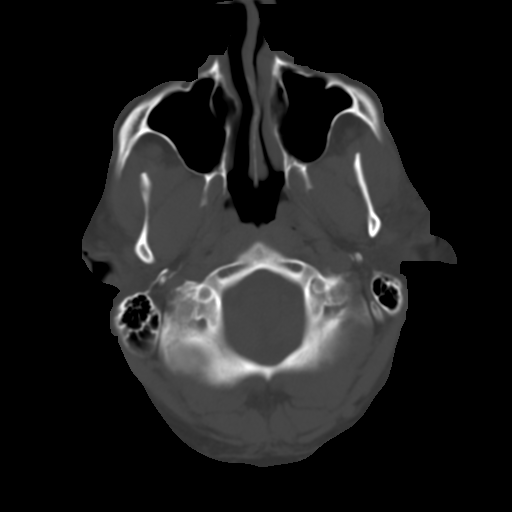
[im 5/32  brain]
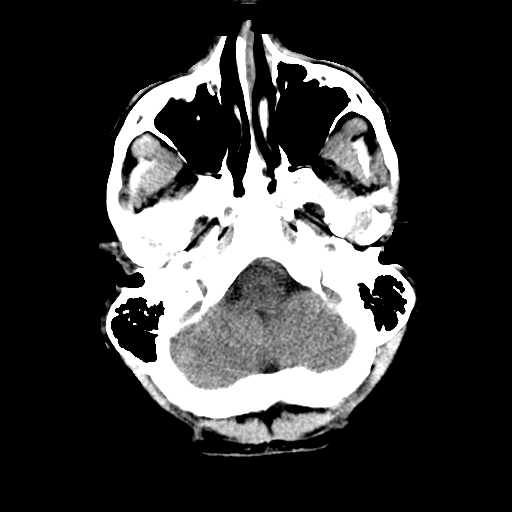
[im 7/32  brain]
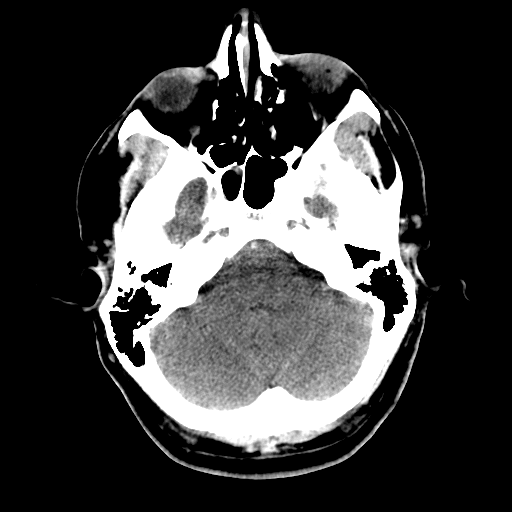
[im 12/32  brain]
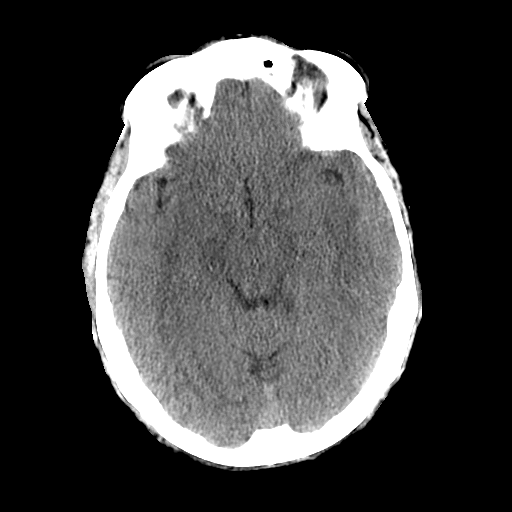
[im 14/32  brain]
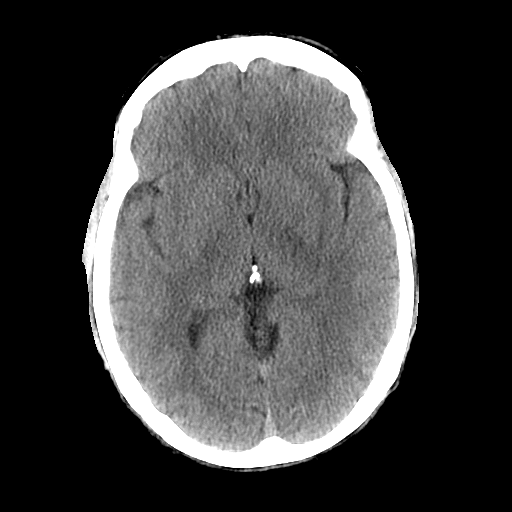
[im 14/32  bone]
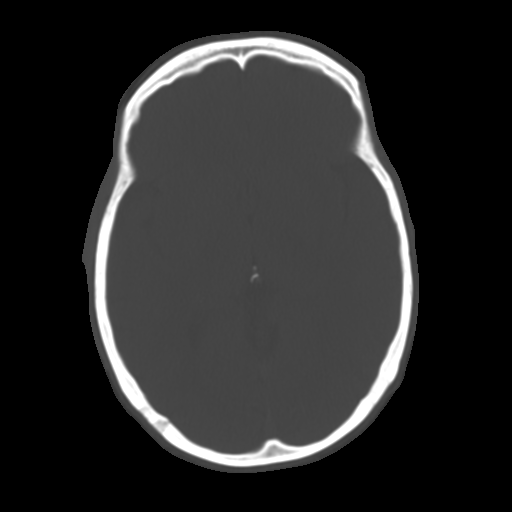
[im 16/32  brain]
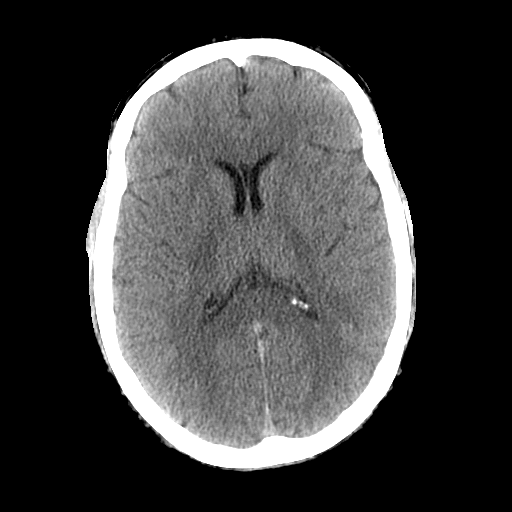
[im 18/32  brain]
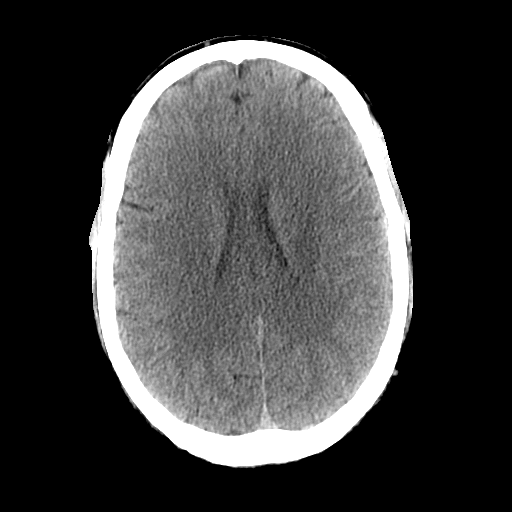
[im 20/32  brain]
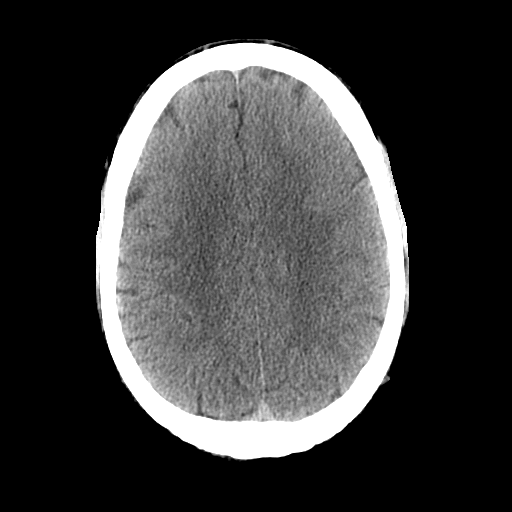
[im 25/32  brain]
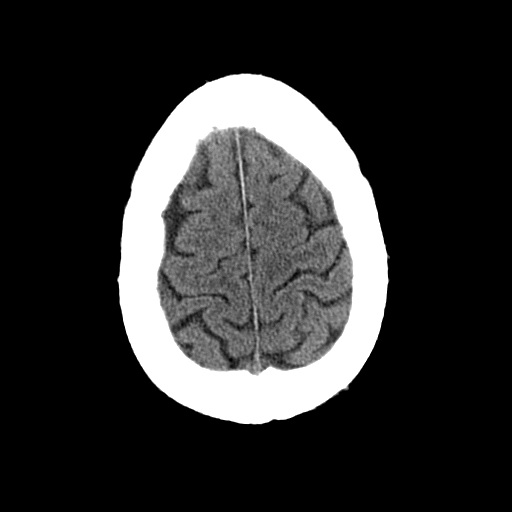
[im 25/32  bone]
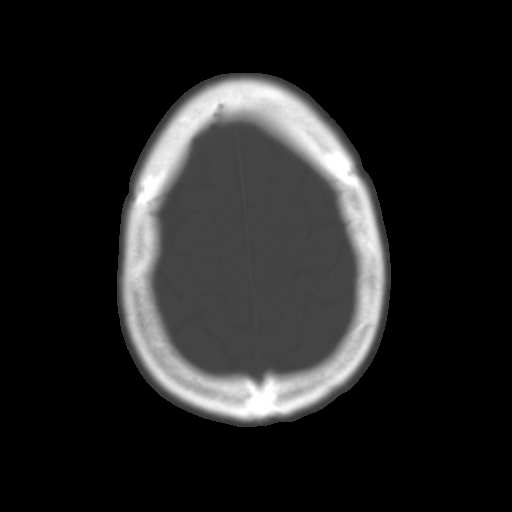
[im 27/32  brain]
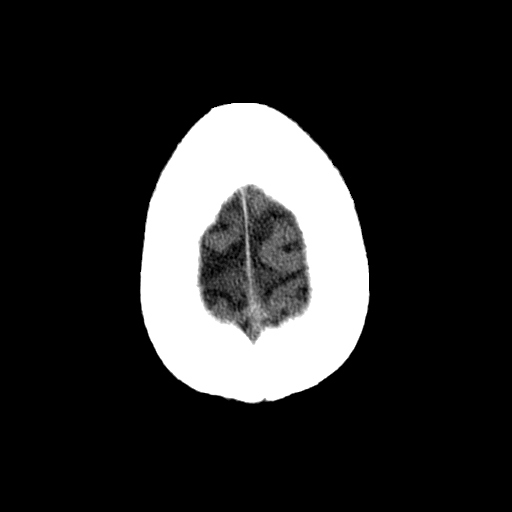
[im 29/32  brain]
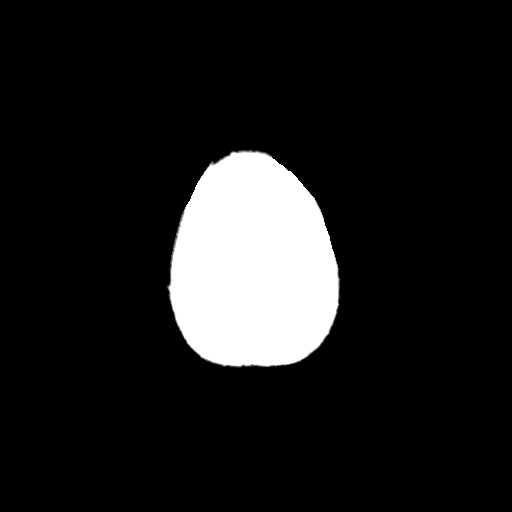

[Series 601: coronal brain · coronal · 0.43mm/px · 3 of 72 slices shown]
[im 24/72  brain]
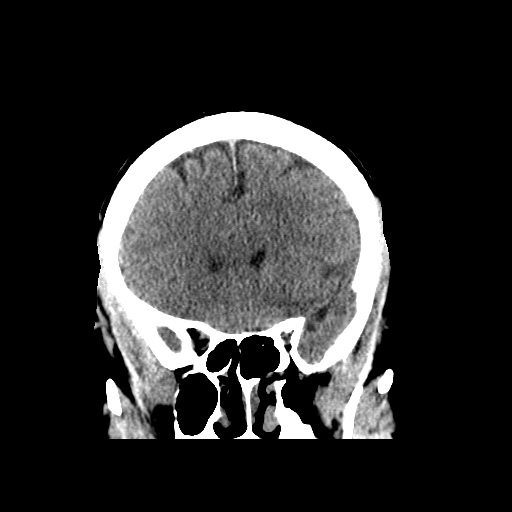
[im 32/72  brain]
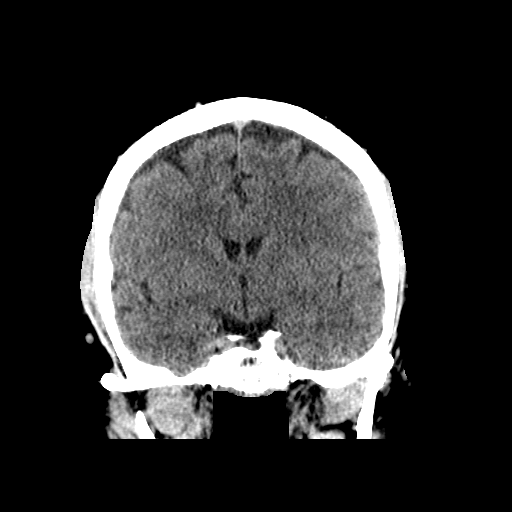
[im 40/72  brain]
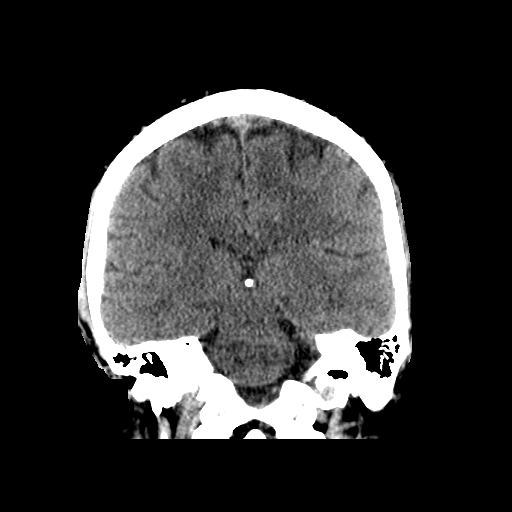

[Series 602: sagittal brain · sagittal · 0.43mm/px · 3 of 64 slices shown]
[im 22/64  brain]
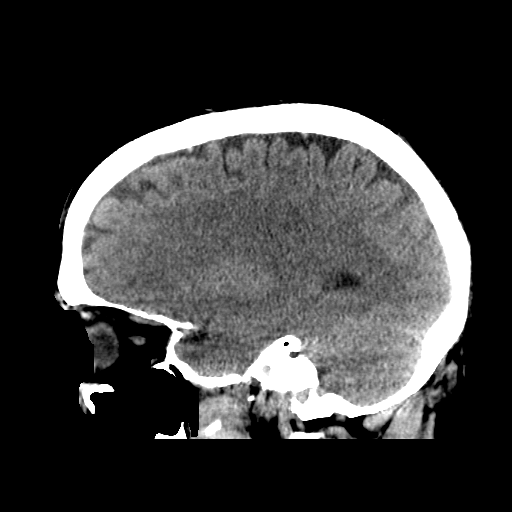
[im 32/64  brain]
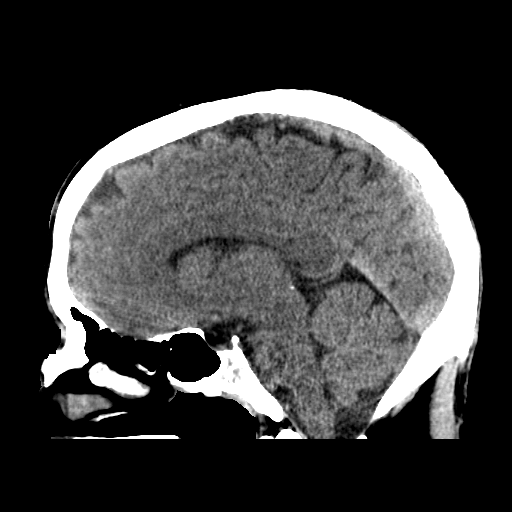
[im 43/64  brain]
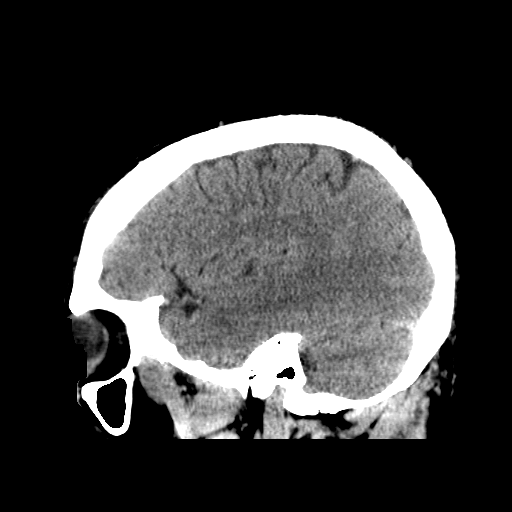

[17 of 47 positions shown; findings below may reference images not displayed]

FINDINGS: Brain: No acute intracranial abnormality. Specifically, no
hemorrhage, hydrocephalus, mass lesion, acute infarction, or
significant intracranial injury.

Vascular: No hyperdense vessel or unexpected calcification.

Skull: No acute calvarial abnormality.

Sinuses/Orbits: Visualized paranasal sinuses and mastoids clear.
Orbital soft tissues unremarkable.

Other: None
IMPRESSION: Normal study.

## 2017-10-11 DIAGNOSIS — S83242D Other tear of medial meniscus, current injury, left knee, subsequent encounter: Secondary | ICD-10-CM | POA: Diagnosis not present

## 2017-10-11 DIAGNOSIS — S83282D Other tear of lateral meniscus, current injury, left knee, subsequent encounter: Secondary | ICD-10-CM | POA: Diagnosis not present

## 2017-10-15 DIAGNOSIS — M25562 Pain in left knee: Secondary | ICD-10-CM | POA: Diagnosis not present

## 2017-10-20 DIAGNOSIS — S83242D Other tear of medial meniscus, current injury, left knee, subsequent encounter: Secondary | ICD-10-CM | POA: Diagnosis not present

## 2018-02-21 ENCOUNTER — Other Ambulatory Visit: Payer: Self-pay | Admitting: Sports Medicine

## 2018-02-21 DIAGNOSIS — S838X2A Sprain of other specified parts of left knee, initial encounter: Secondary | ICD-10-CM

## 2018-02-27 ENCOUNTER — Ambulatory Visit
Admission: RE | Admit: 2018-02-27 | Discharge: 2018-02-27 | Disposition: A | Discharge status: Home / Self Care | Payer: BLUE CROSS/BLUE SHIELD | Source: Ambulatory Visit | Attending: Sports Medicine | Admitting: Sports Medicine

## 2018-02-27 DIAGNOSIS — S838X2A Sprain of other specified parts of left knee, initial encounter: Secondary | ICD-10-CM

## 2018-02-27 DIAGNOSIS — M1712 Unilateral primary osteoarthritis, left knee: Secondary | ICD-10-CM | POA: Insufficient documentation

## 2018-02-27 DIAGNOSIS — M25462 Effusion, left knee: Secondary | ICD-10-CM | POA: Insufficient documentation

## 2018-02-27 DIAGNOSIS — R600 Localized edema: Secondary | ICD-10-CM | POA: Insufficient documentation

## 2018-02-27 DIAGNOSIS — S83232A Complex tear of medial meniscus, current injury, left knee, initial encounter: Secondary | ICD-10-CM | POA: Insufficient documentation

## 2018-02-27 DIAGNOSIS — X58XXXA Exposure to other specified factors, initial encounter: Secondary | ICD-10-CM | POA: Insufficient documentation

## 2018-06-26 ENCOUNTER — Encounter (INDEPENDENT_AMBULATORY_CARE_PROVIDER_SITE_OTHER): Payer: Self-pay

## 2018-06-27 ENCOUNTER — Encounter (INDEPENDENT_AMBULATORY_CARE_PROVIDER_SITE_OTHER): Payer: Self-pay | Admitting: Internal Medicine

## 2018-06-27 ENCOUNTER — Telehealth: Payer: BLUE CROSS/BLUE SHIELD | Admitting: Internal Medicine

## 2018-06-27 DIAGNOSIS — I10 Essential (primary) hypertension: Secondary | ICD-10-CM

## 2018-06-27 MED ORDER — LISINOPRIL 20 MG PO TABS
20.0000 mg | ORAL_TABLET | Freq: Every day | ORAL | 1 refills | Status: DC
Start: 2018-06-27 — End: 2018-07-25

## 2018-06-27 NOTE — Progress Notes (Signed)
Subjective:      Date: 06/27/2018 9:44 AM   Patient ID: Michael Jones is a 48 y.o. male.    Chief Complaint:  Chief Complaint   Patient presents with    Hypertension     elevated BP, not on meds previosly       HPI:  Verbal consent has been obtained from the patient to conduct a video and telephone visit to minimize exposure to COVID-19: yes    Pt is a 49yo male presenting with complaints of elevated Bps. He denies headaches, chest pain and no SOB or leg swelling. He states he moved to [place] from [place] 12/2017 and his PCP there had been monitoring his Bps for a couple of years because they were borderline( but the only ones he is able to pull up on his mychart are in the 120s). He states since moving here he saw ortho for knee problems and BP as below  On 04/24/2018  BP 161/112 168/110 on  05/01/2018 156/106  Brother HTN and subsequent ortho visit was documented as 140+. Pt has also been check his Bps at home and they have been in the 150s/100s range. His wife is a Engineer, civil (consulting).  bg 96 cr 2018 1.2    Problem List:  Patient Active Problem List   Diagnosis    Essential hypertension       Current Medications:  No outpatient medications have been marked as taking for the 06/27/18 encounter (Telemedicine Visit) with Kela Millin, MD.       Allergies:  Allergies   Allergen Reactions    Nabumetone Hives       Past Medical History:  History reviewed. No pertinent past medical history.    Past Surgical History:  History reviewed. No pertinent surgical history.    Family History:  Family History   Problem Relation Age of Onset    Hypertension Brother        Social History:  Social History     Socioeconomic History    Marital status: Married     Spouse name: Not on file    Number of children: Not on file    Years of education: Not on file    Highest education level: Not on file   Occupational History    Not on file   Social Needs    Financial resource strain: Not on file    Food insecurity:     Worry: Not on file      Inability: Not on file    Transportation needs:     Medical: Not on file     Non-medical: Not on file   Tobacco Use    Smoking status: Never Smoker   Substance and Sexual Activity    Alcohol use: Not on file    Drug use: Not on file    Sexual activity: Not on file   Lifestyle    Physical activity:     Days per week: Not on file     Minutes per session: Not on file    Stress: Not on file   Relationships    Social connections:     Talks on phone: Not on file     Gets together: Not on file     Attends religious service: Not on file     Active member of club or organization: Not on file     Attends meetings of clubs or organizations: Not on file     Relationship status: Not on file  Intimate partner violence:     Fear of current or ex partner: Not on file     Emotionally abused: Not on file     Physically abused: Not on file     Forced sexual activity: Not on file   Other Topics Concern    Not on file   Social History Narrative    Not on file       The following sections were reviewed this encounter by the provider:   Allergies   Meds   Problems   Med Hx   Surg Hx   Fam Hx          ROS:  Physical Exam   General/Constitutional:   Denies Chills. Denies Fatigue. Denies Fever.   Ophthalmologic:   Denies Blurred vision. Denies Eye Pain.   ENT:   Denies Nasal Discharge. Denies Ear pain. Denies Sinus pain.   Endocrine:   Denies Polydipsia. Denies Polyuria.   Respiratory:   Denies Cough. Denies Orthopnea. Denies Shortness of breath. Denies Wheezing.   Cardiovascular:   Denies Chest pain. Denies Chest pain with exertion. Denies Leg Claudication. Denies Palpitations. Denies Swelling in hands/feet.   Gastrointestinal:   Denies Abdominal pain. Denies Blood in stool. Denies Constipation. Denies Diarrhea. Denies Heartburn. Denies Nausea. Denies Vomiting.   Genitourinary:   Denies Blood in urine. Denies Frequent urination. Denies Painful urination.   Musculoskeletal:   Denies Leg cramps. Denies Muscle aches.   Skin:    Denies Skin lesion(s).   Neurologic:   Denies Dizziness. Denies Gait abnormality. Denies Headache. Denies Tingling/Numbness.   Objective:       Physical Exam:  Vitals:  There were no vitals taken for this visit. On 04/24/2018  BP 161/112 168/110 on  05/01/2018 156/106  Per pt meter 158/110  General Examination:   GENERAL APPEARANCE: alert, in no acute distress, well developed, well nourished  oriented to time, place, and person. obese     Assessment:       1. Essential hypertension    Plan:     HTN:  Systolic blood pressure is above goal. Continue to optimize low salt diet, aerobic exercise efforts and weight loss/reduction as discussed. and Blood pressure-lowering medication was recommended to patient due to sustained BP elevation as documented at the ortho office visits and at home and SBP>121mmHg or DBP >50mmHg. High BMI follow-up: Encouragement to Exercise. Recommend out-of-office blood pressure measurements for titration of BP-lowering medication.   Recommend adding ACE-I therapy.  Side effects of ACE-I therapy discussed with with patient including angioedema, hyperkalemia, headache, dizziness or NP cough.   Monitor Bps for next 1week and send via mychart as discussed      Time spent in discussion: 30 minutes    Follow-up:   Return in about 4 weeks (around 07/25/2018).     Kela Millin, MD

## 2018-07-19 ENCOUNTER — Other Ambulatory Visit (INDEPENDENT_AMBULATORY_CARE_PROVIDER_SITE_OTHER): Payer: Self-pay | Admitting: Internal Medicine

## 2018-07-19 NOTE — Telephone Encounter (Signed)
Please schedule follow video or phone visit prior to refill as was discussed with pt last visit. Thanks.

## 2018-07-24 ENCOUNTER — Encounter (INDEPENDENT_AMBULATORY_CARE_PROVIDER_SITE_OTHER): Payer: Self-pay | Admitting: Internal Medicine

## 2018-07-24 ENCOUNTER — Other Ambulatory Visit (INDEPENDENT_AMBULATORY_CARE_PROVIDER_SITE_OTHER): Payer: Self-pay | Admitting: Internal Medicine

## 2018-07-25 ENCOUNTER — Telehealth (INDEPENDENT_AMBULATORY_CARE_PROVIDER_SITE_OTHER): Payer: BLUE CROSS/BLUE SHIELD | Admitting: Internal Medicine

## 2018-07-25 ENCOUNTER — Encounter (INDEPENDENT_AMBULATORY_CARE_PROVIDER_SITE_OTHER): Payer: Self-pay | Admitting: Internal Medicine

## 2018-07-25 ENCOUNTER — Other Ambulatory Visit (INDEPENDENT_AMBULATORY_CARE_PROVIDER_SITE_OTHER): Payer: Self-pay

## 2018-07-25 VITALS — BP 152/96

## 2018-07-25 DIAGNOSIS — I10 Essential (primary) hypertension: Secondary | ICD-10-CM

## 2018-07-25 MED ORDER — LISINOPRIL 20 MG PO TABS
20.0000 mg | ORAL_TABLET | Freq: Two times a day (BID) | ORAL | 1 refills | Status: DC
Start: 2018-07-25 — End: 2018-08-16

## 2018-07-25 NOTE — Progress Notes (Addendum)
Good morning Dr.Viyuoh,  Patient has an appointment scheduled with you today @ 3pm

## 2018-07-25 NOTE — Telephone Encounter (Signed)
Medication request sent to dr. Suanne Marker.

## 2018-07-25 NOTE — Telephone Encounter (Signed)
Patient has a video visit today at 300

## 2018-07-25 NOTE — Progress Notes (Signed)
Subjective:      Date: 07/25/2018 3:53 PM   Patient ID: Michael Jones is a 48 y.o. male.    Chief Complaint:  Chief Complaint   Patient presents with    Hypertension     re assess prescription       HPI:   Verbal consent has been obtained from the patient to conduct a video and telephone visit to minimize exposure to COVID-19: yes    Hypertension   The patient is being seen for a routine follow-up of hypertension. Hypertension is classified as essential. Comorbid illnesses include obesity. There are no recent interval events.  There is no interval history of chest pain, chest pressure/discomfort, dyspnea, exertional chest pressure/discomfort, low blood pressure, palpitations, syncope and erectile dysfunction.  Current therapy includes ACE inhibitors. Average ambulatory systolic blood pressure is generally in the 150's. Average ambulatory diastolic blood pressure is generally in the 90's. Lifestyle changes have included improved nutrition, increased exercise and weight loss.      In addition, pt also needs medication refill.  Problem List:  Patient Active Problem List   Diagnosis    Essential hypertension       Current Medications:  Outpatient Medications Marked as Taking for the 07/25/18 encounter (Telemedicine Visit) with Kela Millin, MD   Medication Sig Dispense Refill    Ascorbic Acid (VITAMIN C) 100 MG tablet Take 100 mg by mouth daily      cetirizine (ZYRTEC) 10 MG tablet Take 10 mg by mouth daily      lisinopril (PRINIVIL,ZESTRIL) 20 MG tablet Take 1 tablet (20 mg total) by mouth 2 (two) times daily 180 tablet 1    [DISCONTINUED] lisinopril (PRINIVIL,ZESTRIL) 20 MG tablet Take 1 tablet (20 mg total) by mouth daily 30 tablet 1       Allergies:  Allergies   Allergen Reactions    Nabumetone Hives       Past Medical History:  History reviewed. No pertinent past medical history.    Past Surgical History:  History reviewed. No pertinent surgical history.    Family History:  Family History   Problem  Relation Age of Onset    Hypertension Brother        Social History:  Social History     Socioeconomic History    Marital status: Married     Spouse name: Not on file    Number of children: Not on file    Years of education: Not on file    Highest education level: Not on file   Occupational History    Not on file   Social Needs    Financial resource strain: Not on file    Food insecurity:     Worry: Not on file     Inability: Not on file    Transportation needs:     Medical: Not on file     Non-medical: Not on file   Tobacco Use    Smoking status: Never Smoker    Smokeless tobacco: Never Used   Substance and Sexual Activity    Alcohol use: Yes     Comment: 8    Drug use: Not Currently    Sexual activity: Not Currently   Lifestyle    Physical activity:     Days per week: Not on file     Minutes per session: Not on file    Stress: Not on file   Relationships    Social connections:     Talks on phone: Not on  file     Gets together: Not on file     Attends religious service: Not on file     Active member of club or organization: Not on file     Attends meetings of clubs or organizations: Not on file     Relationship status: Not on file    Intimate partner violence:     Fear of current or ex partner: Not on file     Emotionally abused: Not on file     Physically abused: Not on file     Forced sexual activity: Not on file   Other Topics Concern    Not on file   Social History Narrative    Not on file       The following sections were reviewed this encounter by the provider:   Tobacco   Allergies   Meds   Problems   Med Hx   Surg Hx   Fam Hx          ROS:  Physical Exam   General/Constitutional:   Denies Chills. Denies Fatigue. Denies Fever.   Ophthalmologic:   Denies Blurred vision. Denies Eye Pain.   ENT:   Denies Nasal Discharge. Denies Ear pain. Denies Sinus pain.   Endocrine:   Denies Polydipsia. Denies Polyuria.   Respiratory:   Denies Cough. Denies Orthopnea. Denies Shortness of breath. Denies  Wheezing.   Cardiovascular:   Denies Chest pain. Denies Chest pain with exertion. Denies Leg Claudication. Denies Palpitations. Denies Swelling in hands/feet.   Gastrointestinal:   Denies Abdominal pain. Denies Blood in stool. Denies Constipation. Denies Diarrhea. Denies Heartburn. Denies Nausea. Denies Vomiting.   Genitourinary:   Denies Blood in urine. Denies Frequent urination. Denies Painful urination.   Musculoskeletal:   Denies Leg cramps. Denies Muscle aches.   Skin:   Denies Skin lesion(s).   Neurologic:   Denies Dizziness. Denies Gait abnormality. Denies Headache. Denies Tingling/Numbness.   Objective:       Physical Exam:VIDEO VISIT  Vitals:  BP (!) 152/96     General Examination:   GENERAL APPEARANCE: alert, in no acute distress, well developed, well   PSYCH: alert and oriented to time,place and person.        Assessment:       1. Essential hypertension  - Basic Metabolic Panel; Future  - lisinopril (PRINIVIL,ZESTRIL) 20 MG tablet; Take 1 tablet (20 mg total) by mouth 2 (two) times daily  Dispense: 180 tablet; Refill: 1  pt agrees to come to lab for above test  Plan:     HTN:  Current blood pressure is above goal. Continue to optimize low salt diet and aerobic exercise efforts., Continue current medication regimen. and Recommend increasing dose of  ACE-I therapy TO bid dosing as discussed.  Side effects of ACE-I therapy discussed with with patient including angioedema, hyperkalemia, headache, dizziness or NP cough. High BMI follow-up: Encouragement to Exercise. Recommend optimizing therapeutic lifestyle changes which include obtaining at least 150 minutes of aerobic exercise per week and eating a heart healthy diet ( i.e. DASH diet - www.heart.org or PopSteam.is ). Recommend out-of-office blood pressure measurements for titration of BP-lowering medication.    Pt to email log of BP as discussed in 2weeks, call at anytime if SBP <100    Follow-up:   Return in about 3 months (around 10/25/2018) for HTN.    Time spent in discussion: 15 minutes  Kela Millin, MD

## 2018-07-25 NOTE — Progress Notes (Signed)
Please call and schedule this pt for a phone or video visit ASAP for BP and medication refill. Thanks!

## 2018-08-16 ENCOUNTER — Other Ambulatory Visit (INDEPENDENT_AMBULATORY_CARE_PROVIDER_SITE_OTHER): Payer: Self-pay | Admitting: Internal Medicine

## 2018-08-16 DIAGNOSIS — I10 Essential (primary) hypertension: Secondary | ICD-10-CM

## 2018-08-16 MED ORDER — LISINOPRIL 20 MG PO TABS
20.0000 mg | ORAL_TABLET | Freq: Every day | ORAL | 0 refills | Status: DC
Start: 2018-08-16 — End: 2018-09-26

## 2018-08-16 NOTE — Telephone Encounter (Signed)
Please inform pt to come the lab for the test ordered as discussed during recent visit>>this test needed to help with refilling his meds, and also to send in his blood pressures to [place] by mychart or if he can give them directly to you when you talk with him. thanks

## 2018-08-16 NOTE — Telephone Encounter (Signed)
Spoke with patient and he verbalized understanding. Patient will upload BP readings to Mychart.

## 2018-08-18 ENCOUNTER — Encounter (INDEPENDENT_AMBULATORY_CARE_PROVIDER_SITE_OTHER): Payer: Self-pay | Admitting: Internal Medicine

## 2018-08-30 ENCOUNTER — Other Ambulatory Visit (FREE_STANDING_LABORATORY_FACILITY): Payer: BLUE CROSS/BLUE SHIELD

## 2018-08-30 DIAGNOSIS — I10 Essential (primary) hypertension: Secondary | ICD-10-CM

## 2018-08-30 LAB — HEMOLYSIS INDEX: Hemolysis Index: 9 (ref 0–18)

## 2018-08-30 LAB — BASIC METABOLIC PANEL
BUN: 19 mg/dL (ref 9.0–28.0)
CO2: 25 mEq/L (ref 21–29)
Calcium: 9.6 mg/dL (ref 8.5–10.5)
Chloride: 103 mEq/L (ref 100–111)
Creatinine: 1.1 mg/dL (ref 0.5–1.5)
Glucose: 136 mg/dL — ABNORMAL HIGH (ref 70–100)
Potassium: 4.5 mEq/L (ref 3.5–5.1)
Sodium: 137 mEq/L (ref 136–145)

## 2018-08-30 LAB — GFR: EGFR: >60

## 2018-09-26 ENCOUNTER — Telehealth (INDEPENDENT_AMBULATORY_CARE_PROVIDER_SITE_OTHER): Payer: BLUE CROSS/BLUE SHIELD | Admitting: Internal Medicine

## 2018-09-26 ENCOUNTER — Encounter (INDEPENDENT_AMBULATORY_CARE_PROVIDER_SITE_OTHER): Payer: Self-pay | Admitting: Internal Medicine

## 2018-09-26 DIAGNOSIS — I10 Essential (primary) hypertension: Secondary | ICD-10-CM

## 2018-09-26 MED ORDER — LISINOPRIL 20 MG PO TABS
20.0000 mg | ORAL_TABLET | Freq: Two times a day (BID) | ORAL | 0 refills | Status: DC
Start: 2018-09-26 — End: 2018-10-06

## 2018-09-26 NOTE — Progress Notes (Signed)
Subjective:      Date: 09/26/2018 12:54 AM   Patient ID: Michael Jones is a 48 y.o. male.    Chief Complaint:  Chief Complaint   Patient presents with    Hypertension     f/u       HPI:   Verbal consent has been obtained from the patient to conduct a video and telephone visit to minimize exposure to COVID-19: yes    Hypertension   The patient is being seen for a routine follow-up of hypertension. Hypertension is classified as essential. There is no history of CKD-Stage 1, CKD-Stage 2, CKD-Stage 3, CKD-Stage 4, CKD-Stage 5 and CVA. There are no recent interval events.  There is no interval history of chest pain, dyspnea, exertional chest pressure/discomfort, low blood pressure, lower extremity edema and palpitations.  Current therapy includes ACE inhibitors. Patient is compliant with the current regimen.   States mostly 130s-140s Lifestyle changes have included improved nutrition, increased exercise and weight loss. States has lost 20lbs in the past 2.52mos   exercises biking for about 1hour daily  In addition, pt also needs medication refill.  Problem List:  Patient Active Problem List   Diagnosis    Essential hypertension       Current Medications:  Outpatient Medications Marked as Taking for the 09/26/18 encounter (Telemedicine Visit) with Kela Millin, MD   Medication Sig Dispense Refill    Ascorbic Acid (VITAMIN C) 100 MG tablet Take 100 mg by mouth daily      fexofenadine-pseudoephedrine (ALLEGRA-D 24) 180-240 MG per 24 hr tablet Take 1 tablet by mouth daily      lisinopril (ZESTRIL) 20 MG tablet Take 1 tablet (20 mg total) by mouth 2 (two) times daily 180 tablet 0    [DISCONTINUED] lisinopril (ZESTRIL) 20 MG tablet Take 1 tablet (20 mg total) by mouth daily 30 tablet 0       Allergies:  Allergies   Allergen Reactions    Nabumetone Hives       Past Medical History:  History reviewed. No pertinent past medical history.    Past Surgical History:  Past Surgical History:   Procedure Laterality Date     KNEE SURGERY      KNEE SURGERY  02/2017       Family History:  Family History   Problem Relation Age of Onset    Hypertension Brother     Lung cancer Mother     Cancer Father        Social History:  Social History     Socioeconomic History    Marital status: Married     Spouse name: Not on file    Number of children: Not on file    Years of education: Not on file    Highest education level: Not on file   Occupational History    Not on file   Social Needs    Financial resource strain: Not on file    Food insecurity     Worry: Not on file     Inability: Not on file    Transportation needs     Medical: Not on file     Non-medical: Not on file   Tobacco Use    Smoking status: Never Smoker    Smokeless tobacco: Former Neurosurgeon   Substance and Sexual Activity    Alcohol use: Yes     Comment: 14 wine or beer per week    Drug use: Not Currently    Sexual activity:  Yes     Partners: Female     Birth control/protection: None   Lifestyle    Physical activity     Days per week: Not on file     Minutes per session: Not on file    Stress: Not on file   Relationships    Social connections     Talks on phone: Not on file     Gets together: Not on file     Attends religious service: Not on file     Active member of club or organization: Not on file     Attends meetings of clubs or organizations: Not on file     Relationship status: Not on file    Intimate partner violence     Fear of current or ex partner: Not on file     Emotionally abused: Not on file     Physically abused: Not on file     Forced sexual activity: Not on file   Other Topics Concern    Not on file   Social History Narrative    Not on file       The following sections were reviewed this encounter by the provider:   Tobacco   Allergies   Meds   Problems   Med Hx   Surg Hx   Fam Hx          ROS:  Physical Exam   General/Constitutional:   Denies Chills. Denies Fatigue. Denies Fever.   Ophthalmologic:   Denies Blurred vision. Denies Eye Pain.   ENT:    Denies Nasal Discharge. Denies Ear pain. Denies Sinus pain.   Endocrine:   Denies Polydipsia. Denies Polyuria.   Respiratory:   Denies Cough. Denies Orthopnea. Denies Shortness of breath. Denies Wheezing.   Cardiovascular:   Denies Chest pain. Denies Chest pain with exertion. Denies Leg Claudication. Denies Palpitations. Denies Swelling in hands/feet.   Gastrointestinal:   Denies Abdominal pain. Denies Blood in stool. Denies Constipation. Denies Diarrhea. Denies Heartburn. Denies Nausea. Denies Vomiting.   Genitourinary:   Denies Blood in urine. Denies Frequent urination. Denies Painful urination.   Musculoskeletal:   Denies Leg cramps. Denies Muscle aches.   Skin:   Denies rash  Neurologic:   Denies Dizziness. Denies Gait abnormality. Denies Headache. Denies Tingling/Numbness.   Objective:       Physical Exam:  Vitals:  BP 135/75 (BP Site: Left arm)    Pulse 62    Ht 1.778 m (5\' 10" )    Wt 89.4 kg (197 lb)    BMI 28.27 kg/m     General Examination:   GENERAL APPEARANCE: alert, in no acute distress, well developed, well nourished  oriented to time, place, and person.   PSYCH: alert and oriented to time,place and person.   Labs  Recent Results (from the past 2016 hour(s))   Basic Metabolic Panel    Collection Time: 08/30/18 11:33 AM   Result Value Ref Range    Glucose 136 (H) 70 - 100 mg/dL    BUN 16.1 9.0 - 09.6 mg/dL    Creatinine 1.1 0.5 - 1.5 mg/dL    Calcium 9.6 8.5 - 04.5 mg/dL    Sodium 409 811 - 914 mEq/L    Potassium 4.5 3.5 - 5.1 mEq/L    Chloride 103 100 - 111 mEq/L    CO2 25 21 - 29 mEq/L   Hemolysis index    Collection Time: 08/30/18 11:33 AM   Result Value Ref Range  Hemolysis Index 9 0 - 18   GFR    Collection Time: 08/30/18 11:33 AM   Result Value Ref Range    EGFR >60.0           Assessment:       1. Essential hypertension  - lisinopril (ZESTRIL) 20 MG tablet; Take 1 tablet (20 mg total) by mouth 2 (two) times daily  Dispense: 180 tablet; Refill: 0   - at goal today but pt concerned still gets  elevated Bps at times, so have recommended that he keeps log of his Bps( he did not have them written down today) for the next week and follows up after 1wk>>phone or video.  Plan:     HTN:  Current blood pressure is at goal.as above Continue to optimize low salt diet and aerobic exercise efforts. and Continue current medication regimen. High BMI follow-up: Encouragement to Exercise. Recommend out-of-office blood pressure measurements for titration of BP-lowering medication.  -results reviewed as above>> elevated BG noted low carb diet, continue wt loss, follow up and recheck next in office appt    Follow-up:   Return in about 3 months (around 12/27/2018) for HTN.   And in 1week as above  Time spent in discussion: 10 minutes    Kela Millin, MD

## 2018-09-26 NOTE — Progress Notes (Signed)
Have you seen any specialists/other providers since your last visit with us?    No    Arm preference verified?   Yes    The patient is due for nothing at this time, HM is up-to-date.

## 2018-10-06 ENCOUNTER — Encounter (INDEPENDENT_AMBULATORY_CARE_PROVIDER_SITE_OTHER): Payer: Self-pay | Admitting: Internal Medicine

## 2018-10-06 ENCOUNTER — Other Ambulatory Visit (INDEPENDENT_AMBULATORY_CARE_PROVIDER_SITE_OTHER): Payer: Self-pay | Admitting: Internal Medicine

## 2018-10-06 MED ORDER — LISINOPRIL 20 MG PO TABS
30.0000 mg | ORAL_TABLET | Freq: Two times a day (BID) | ORAL | 0 refills | Status: DC
Start: 2018-10-06 — End: 2019-01-02

## 2018-12-03 ENCOUNTER — Emergency Department
Admission: EM | Admit: 2018-12-03 | Discharge: 2018-12-04 | Disposition: A | Discharge status: Home / Self Care | Payer: BLUE CROSS/BLUE SHIELD | Attending: Emergency Medicine | Admitting: Emergency Medicine

## 2018-12-03 ENCOUNTER — Emergency Department
Admission: EM | Admit: 2018-12-03 | Discharge: 2018-12-03 | Payer: BLUE CROSS/BLUE SHIELD | Attending: Emergency Medicine | Admitting: Emergency Medicine

## 2018-12-03 DIAGNOSIS — Y9355 Activity, bike riding: Secondary | ICD-10-CM | POA: Insufficient documentation

## 2018-12-03 DIAGNOSIS — S31114A Laceration without foreign body of abdominal wall, left lower quadrant without penetration into peritoneal cavity, initial encounter: Secondary | ICD-10-CM | POA: Insufficient documentation

## 2018-12-03 DIAGNOSIS — S50311A Abrasion of right elbow, initial encounter: Secondary | ICD-10-CM | POA: Insufficient documentation

## 2018-12-03 DIAGNOSIS — Z23 Encounter for immunization: Secondary | ICD-10-CM | POA: Insufficient documentation

## 2018-12-03 DIAGNOSIS — S3991XA Unspecified injury of abdomen, initial encounter: Secondary | ICD-10-CM

## 2018-12-03 DIAGNOSIS — S31119A Laceration without foreign body of abdominal wall, unspecified quadrant without penetration into peritoneal cavity, initial encounter: Secondary | ICD-10-CM

## 2018-12-03 DIAGNOSIS — S80211A Abrasion, right knee, initial encounter: Secondary | ICD-10-CM | POA: Insufficient documentation

## 2018-12-03 DIAGNOSIS — I10 Essential (primary) hypertension: Secondary | ICD-10-CM | POA: Insufficient documentation

## 2018-12-03 HISTORY — DX: Essential (primary) hypertension: I10

## 2018-12-03 LAB — CBC AND DIFFERENTIAL
Absolute NRBC: 0 10*3/uL (ref 0.00–0.00)
Basophils Absolute Automated: 0.05 10*3/uL (ref 0.00–0.08)
Basophils Automated: 0.3 %
Eosinophils Absolute Automated: 0.13 10*3/uL (ref 0.00–0.44)
Eosinophils Automated: 0.7 %
Hematocrit: 44.6 % (ref 37.6–49.6)
Hgb: 15.3 g/dL (ref 12.5–17.1)
Immature Granulocytes Absolute: 0.07 10*3/uL (ref 0.00–0.07)
Immature Granulocytes: 0.4 %
Lymphocytes Absolute Automated: 1.98 10*3/uL (ref 0.42–3.22)
Lymphocytes Automated: 11.1 %
MCH: 31.5 pg (ref 25.1–33.5)
MCHC: 34.3 g/dL (ref 31.5–35.8)
MCV: 91.8 fL (ref 78.0–96.0)
MPV: 10.6 fL (ref 8.9–12.5)
Monocytes Absolute Automated: 1.08 10*3/uL — ABNORMAL HIGH (ref 0.21–0.85)
Monocytes: 6.1 %
Neutrophils Absolute: 14.51 10*3/uL — ABNORMAL HIGH (ref 1.10–6.33)
Neutrophils: 81.4 %
Nucleated RBC: 0 /100 WBC (ref 0.0–0.0)
Platelets: 262 10*3/uL (ref 142–346)
RBC: 4.86 10*6/uL (ref 4.20–5.90)
RDW: 13 % (ref 11–15)
WBC: 17.82 10*3/uL — ABNORMAL HIGH (ref 3.10–9.50)

## 2018-12-03 LAB — GFR: EGFR: >60

## 2018-12-03 LAB — COMPREHENSIVE METABOLIC PANEL
ALT: 20 U/L (ref 0–55)
AST (SGOT): 91 U/L — ABNORMAL HIGH (ref 5–34)
Albumin/Globulin Ratio: 1.8 (ref 0.9–2.2)
Albumin: 4.3 g/dL (ref 3.5–5.0)
Alkaline Phosphatase: 59 U/L (ref 38–106)
Anion Gap: 16 — ABNORMAL HIGH (ref 5.0–15.0)
BUN: 16 mg/dL (ref 9–28)
Bilirubin, Total: 0.4 mg/dL (ref 0.2–1.2)
CO2: 19 mEq/L — ABNORMAL LOW (ref 22–29)
Calcium: 9.1 mg/dL (ref 8.5–10.5)
Chloride: 101 mEq/L (ref 100–111)
Creatinine: 1.2 mg/dL (ref 0.7–1.3)
Globulin: 2.4 g/dL (ref 2.0–3.6)
Glucose: 95 mg/dL (ref 70–100)
Potassium: 4.6 mEq/L (ref 3.5–5.1)
Protein, Total: 6.7 g/dL (ref 6.0–8.3)
Sodium: 136 mEq/L (ref 136–145)

## 2018-12-03 LAB — PT AND APTT
PT INR: 1 (ref 0.9–1.1)
PT: 13.5 s (ref 12.6–15.0)
PTT: 26 s (ref 23–37)

## 2018-12-03 MED ORDER — TETANUS-DIPHTH-ACELL PERTUSSIS 5-2.5-18.5 LF-MCG/0.5 IM SUSP
0.50 mL | Freq: Once | INTRAMUSCULAR | Status: AC
Start: 2018-12-03 — End: 2018-12-03
  Administered 2018-12-03: 20:00:00 0.5 mL via INTRAMUSCULAR
  Filled 2018-12-03: qty 0.5

## 2018-12-03 MED ORDER — SODIUM CHLORIDE 0.9 % IV BOLUS
1000.00 mL | Freq: Once | INTRAVENOUS | Status: AC
Start: 2018-12-03 — End: 2018-12-03
  Administered 2018-12-03: 23:00:00 1000 mL via INTRAVENOUS

## 2018-12-03 MED ORDER — ACETAMINOPHEN 325 MG PO TABS
650.0000 mg | ORAL_TABLET | Freq: Once | ORAL | Status: DC
Start: 2018-12-03 — End: 2018-12-04

## 2018-12-03 MED ORDER — CEFAZOLIN 1 GM MBP (CNR)
1.00 g | Freq: Once | Status: AC
Start: 2018-12-03 — End: 2018-12-03
  Administered 2018-12-03: 21:00:00 1 g via INTRAVENOUS
  Filled 2018-12-03: qty 100

## 2018-12-03 MED ORDER — CEFAZOLIN SODIUM-DEXTROSE 2-3 GM-%(50ML) IV SOLR
2.00 g | Freq: Once | INTRAVENOUS | Status: DC
Start: 2018-12-03 — End: 2018-12-03

## 2018-12-03 MED ORDER — MORPHINE SULFATE 4 MG/ML IJ/IV SOLN (WRAP)
4.0000 mg | Freq: Once | Status: DC
Start: 2018-12-03 — End: 2018-12-03

## 2018-12-03 MED ORDER — BACITRACIN +/- ZINC 500 UNIT/GM EX OINT (WRAP)
TOPICAL_OINTMENT | Freq: Once | CUTANEOUS | Status: AC
Start: 2018-12-03 — End: 2018-12-03
  Administered 2018-12-03: 20:00:00 1 g via TOPICAL
  Filled 2018-12-03: qty 1

## 2018-12-03 MED ORDER — CEFAZOLIN SODIUM 1 G IJ SOLR
1.00 g | Freq: Once | INTRAMUSCULAR | Status: AC
Start: 2018-12-03 — End: 2018-12-03
  Administered 2018-12-03: 23:00:00 1 g via INTRAVENOUS
  Filled 2018-12-03: qty 1000
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
## 2018-12-03 NOTE — ED Notes (Signed)
Pt moved to urinate with urinal and bleeding in left groin lac returned. Pressure applied to groin. Bleeding controlled. Dry dressing in place over lac taped into place per provider request. Per provider, hold wound care due to unknown depth of lac
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
## 2018-12-03 NOTE — ED Notes (Signed)
Surgery team at bedside evaluating pt
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
## 2018-12-03 NOTE — ED Notes (Signed)
Pts at bedside
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
## 2018-12-03 NOTE — ED Notes (Signed)
PTS is here

## 2018-12-03 NOTE — ED Notes (Signed)
Pt signed patient transfer form at this time with wife witness at bedside
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
## 2018-12-03 NOTE — ED Notes (Signed)
Bed: E06  Expected date:   Expected time:   Means of arrival:   Comments:

## 2018-12-03 NOTE — ED Notes (Signed)
Grace NP at bedside.

## 2018-12-03 NOTE — ED Notes (Signed)
Bed: S 20  Expected date:   Expected time:   Means of arrival:   Comments:  Michael Jones, Michael Jones

## 2018-12-04 LAB — BASIC METABOLIC PANEL
Anion Gap: 13 (ref 5.0–15.0)
BUN: 16 mg/dL (ref 9.0–28.0)
CO2: 20 mEq/L — ABNORMAL LOW (ref 22–29)
Calcium: 9 mg/dL (ref 8.5–10.5)
Chloride: 102 mEq/L (ref 100–111)
Creatinine: 1.2 mg/dL (ref 0.7–1.3)
Glucose: 93 mg/dL (ref 70–100)
Potassium: 4.5 mEq/L (ref 3.5–5.1)
Sodium: 135 mEq/L — ABNORMAL LOW (ref 136–145)

## 2018-12-04 LAB — CBC AND DIFFERENTIAL
Absolute NRBC: 0 10*3/uL (ref 0.00–0.00)
Basophils Absolute Automated: 0.05 10*3/uL (ref 0.00–0.08)
Basophils Automated: 0.3 %
Eosinophils Absolute Automated: 0.14 10*3/uL (ref 0.00–0.44)
Eosinophils Automated: 0.9 %
Hematocrit: 42.9 % (ref 37.6–49.6)
Hgb: 15 g/dL (ref 12.5–17.1)
Immature Granulocytes Absolute: 0.09 10*3/uL — ABNORMAL HIGH (ref 0.00–0.07)
Immature Granulocytes: 0.6 %
Lymphocytes Absolute Automated: 2.44 10*3/uL (ref 0.42–3.22)
Lymphocytes Automated: 16 %
MCH: 32 pg (ref 25.1–33.5)
MCHC: 35 g/dL (ref 31.5–35.8)
MCV: 91.5 fL (ref 78.0–96.0)
MPV: 10.9 fL (ref 8.9–12.5)
Monocytes Absolute Automated: 1.12 10*3/uL — ABNORMAL HIGH (ref 0.21–0.85)
Monocytes: 7.4 %
Neutrophils Absolute: 11.38 10*3/uL — ABNORMAL HIGH (ref 1.10–6.33)
Neutrophils: 74.8 %
Nucleated RBC: 0 /100 WBC (ref 0.0–0.0)
Platelets: 267 10*3/uL (ref 142–346)
RBC: 4.69 10*6/uL (ref 4.20–5.90)
RDW: 14 % (ref 11–15)
WBC: 15.22 10*3/uL — ABNORMAL HIGH (ref 3.10–9.50)

## 2018-12-04 LAB — PT/INR
PT INR: 1 (ref 0.9–1.1)
PT: 12.8 s (ref 12.6–15.0)

## 2018-12-04 LAB — ECG 12-LEAD
Atrial Rate: 72 {beats}/min
P Axis: 42 degrees
P-R Interval: 144 ms
Q-T Interval: 416 ms
QRS Duration: 98 ms
QTC Calculation (Bezet): 455 ms
R Axis: 3 degrees
T Axis: 6 degrees
Ventricular Rate: 72 {beats}/min

## 2018-12-04 LAB — GFR: EGFR: >60

## 2018-12-04 MED ORDER — OXYCODONE-ACETAMINOPHEN 5-325 MG PO TABS
1.00 | ORAL_TABLET | Freq: Once | ORAL | Status: AC
Start: 2018-12-04 — End: 2018-12-04
  Administered 2018-12-04: 1 via ORAL
  Filled 2018-12-04: qty 1

## 2018-12-04 MED ORDER — OXYCODONE-ACETAMINOPHEN 5-325 MG PO TABS
1.0000 | ORAL_TABLET | ORAL | 0 refills | Status: DC | PRN
Start: 2018-12-04 — End: 2018-12-07

## 2018-12-04 NOTE — Discharge Instructions (Signed)
Dear Michael Jones:    Thank you for choosing the [facility] Emergency Department, the premier emergency department in the [place] area.  I hope your visit today was EXCELLENT. You will receive a survey via text message that will give you the opportunity to provide feedback to your team about your visit. Please do not hesitate to reach out with any questions!    Specific instructions for your visit today:    Straddle Injury    You have been seen for an injury to your groin. This is sometimes called a "straddle injury."    This injury is to the groin (crotch) and genitals (sex organs). It happens when a person falls with his/her legs spread across a narrow object like the crossbar of a bicycle.    The injury causes pain, swelling and bruising in the groin and genitals. It may hurt to urinate (pee). You may also notice blood in your urine. It normally takes a few days to a week to get better.    To treat it, rest and avoid getting injured again. Use ice on the injured area to help with the pain.   Place some ice cubes in a re-sealable (Ziploc) bag and add some water. Put a thin washcloth between the bag and the skin. Apply the ice bag to the area for at least 20 minutes. Do this at least 4 times per day. It is okay to do this more often than directed. You can also do it for longer than directed. NEVER APPLY ICE DIRECTLY TO THE SKIN.    Use pain medicines such as ibuprofen (Advil or Motrin) and acetaminophen (Tylenol) as needed for pain. Also use any other pain medications prescribed by the doctor.    Have your injury checked again in the next 3-5 days by your doctor, clinic or specialist. The medical staff will give you instructions.    YOU SHOULD SEEK MEDICAL ATTENTION IMMEDIATELY, EITHER HERE OR AT THE NEAREST EMERGENCY DEPARTMENT, IF ANY OF THE FOLLOWING OCCURS:   Your pain gets worse, even if you are taking pain medication.   You have fever (temperature higher than 100.9F / 38C) or the injured  areas are red. If there is any drainage or pus coming from the injured areas.   There is still blood or there is more blood or blood clots in your urine.   You have trouble urinating (peeing) or cannot urinate.   It is very painful when you try to have a bowel movement.   You have abdominal (belly) pain.     YOUR SUTURES COME OUT IN 14 DAYS YOUR PRIMARY CARE DR OR URGENT CARE CAN REMOVE THEM KEEP THE AREA CLEAN DRY             IF YOU DO NOT CONTINUE TO IMPROVE OR YOUR CONDITION WORSENS, PLEASE CONTACT YOUR DOCTOR OR RETURN IMMEDIATELY TO THE EMERGENCY DEPARTMENT.    Sincerely,  Mavrophilipos, Vasilios *  Attending Emergency Physician  [facility] Emergency Department    ONSITE PHARMACY  Our full service onsite pharmacy is located in the ER waiting room.  Open 7 days a week from 9 am to 9 pm.  We accept all major insurances and prices are competitive with major retailers.  Ask your provider to print your prescriptions down to the pharmacy to speed you on your way home.    OBTAINING A PRIMARY CARE APPOINTMENT    Primary care physicians (PCPs, also known as primary care doctors) are either  internists or family medicine doctors. Both types of PCPs focus on health promotion, disease prevention, patient education and counseling, and treatment of acute and chronic medical conditions.    Call for an appointment with a primary care doctor.  Ask to see who is taking new patients.     [facility]  telephone:  [phone]  [email or web address]    DOCTOR REFERRALS  Call [phone] (available 24 hours a day, 7 days a week) if you need any further referrals and we can help you find a primary care doctor or specialist.  Also, available online at:  [email or web address]    YOUR CONTACT INFORMATION  Before leaving please check with registration to make sure we have an up-to-date contact number.  You can call registration at [phone] to update your information.  For questions about your  hospital bill, please call [phone].  For questions about your Emergency Dept Physician bill please call [phone].      FREE HEALTH SERVICES  If you need help with health or social services, please call 2-1-1 for a free referral to resources in your area.  2-1-1 is a free service connecting people with information on health insurance, free clinics, pregnancy, mental health, dental care, food assistance, housing, and substance abuse counseling.  Also, available online at:  http://www.[facility].org    MEDICAL RECORDS AND TESTS  Certain laboratory test results do not come back the same day, for example urine cultures.   We will contact you if other important findings are noted.  Radiology films are often reviewed again to ensure accuracy.  If there is any discrepancy, we will notify you.      Please call [phone] to pick up a complimentary CD of any radiology studies performed.  If you or your doctor would like to request a copy of your medical records, please call [phone].      ORTHOPEDIC INJURY   Please know that significant injuries can exist even when an initial x-ray is read as normal or negative.  This can occur because some fractures (broken bones) are not initially visible on x-rays.  For this reason, close outpatient follow-up with your primary care doctor or bone specialist (orthopedist) is required.    MEDICATIONS AND FOLLOWUP  Please be aware that some prescription medications can cause drowsiness.  Use caution when driving or operating machinery.    The examination and treatment you have received in our Emergency Department is provided on an emergency basis, and is not intended to be a substitute for your primary care physician.  It is important that your doctor checks you again and that you report any new or remaining problems at that time.      24 HOUR PHARMACIES  The nearest 24 hour pharmacy is:    [facility]  [address]  [place], [place]  [address]  [phone]      ASSISTANCE WITH INSURANCE    Affordable Care Act  [facility])  Call to start or finish an application, compare plans, enroll or ask a question.  [phone]  TTY: [phone]  Web:  Healthcare.gov    Help Enrolling in [facility]  [employer] [place]  ([phone] (TOLL-FREE)  [phone] (TTY)  Web:  Http://www.coverva.org    Local Help Enrolling in the [facility]  [place] [facility]  [phone] (MAIN)  Email:  health-help@nvfs .org  Web:  [employer]jackMyths.is  Address:  [address] [place], [place] [address]    SEDATING MEDICATIONS  Sedating medications include  strong pain medications (e.g. narcotics), muscle relaxers, benzodiazepines (used for anxiety and as muscle relaxers), Benadryl/diphenhydramine and other antihistamines for allergic reactions/itching, and other medications.  If you are unsure if you have received a sedating medication, please ask your physician or nurse.  If you received a sedating medication: DO NOT drive a car. DO NOT operate machinery. DO NOT perform jobs where you need to be alert.  DO NOT drink alcoholic beverages while taking this medicine.     If you get dizzy, sit or lie down at the first signs. Be careful going up and down stairs.  Be extra careful to prevent falls.     Never give this medicine to others.     Keep this medicine out of reach of children.     Do not take or save old medicines. Throw them away when outdated.     Keep all medicines in a cool, dry place. DO NOT keep them in your bathroom medicine cabinet or in a cabinet above the stove.    MEDICATION REFILLS  Please be aware that we cannot refill any prescriptions through the ER. If you need further treatment from what is provided at your ER visit, please follow up with your primary care doctor or your pain management specialist.    FREESTANDING EMERGENCY DEPARTMENTS OF [facility]  Did you know [facility] has two freestanding ERs located just a few miles away?  [place]  [facility] of [place] and [place] [facility] of [place] have short wait times, easy free parking directly in front of the building and top patient satisfaction scores - and the same Board Certified Emergency Medicine doctors as [facility].

## 2018-12-07 ENCOUNTER — Encounter (INDEPENDENT_AMBULATORY_CARE_PROVIDER_SITE_OTHER): Payer: Self-pay | Admitting: Family Medicine

## 2018-12-07 ENCOUNTER — Ambulatory Visit (FREE_STANDING_LABORATORY_FACILITY): Payer: BLUE CROSS/BLUE SHIELD

## 2018-12-07 ENCOUNTER — Telehealth (INDEPENDENT_AMBULATORY_CARE_PROVIDER_SITE_OTHER): Payer: BLUE CROSS/BLUE SHIELD | Admitting: Family Medicine

## 2018-12-07 DIAGNOSIS — Z20822 Contact with and (suspected) exposure to covid-19: Secondary | ICD-10-CM

## 2018-12-07 DIAGNOSIS — R6889 Other general symptoms and signs: Secondary | ICD-10-CM

## 2018-12-07 DIAGNOSIS — Z20828 Contact with and (suspected) exposure to other viral communicable diseases: Secondary | ICD-10-CM

## 2018-12-07 MED ORDER — OXYCODONE-ACETAMINOPHEN 5-325 MG PO TABS
1.00 | ORAL_TABLET | ORAL | 0 refills | Status: AC | PRN
Start: 2018-12-07 — End: 2018-12-10

## 2018-12-07 NOTE — Progress Notes (Signed)
Subjective:      Date: 12/07/2018 7:10 PM   Patient ID: Mikhi Athey is a 48 y.o. male.    Chief Complaint:  Close exposure to + covid virus  Requests to be tested for COVID    Verbal consent has been obtained from the patient to conduct a video visit to minimize exposure to COVID-19: yes      HPI:  HPI   Patient is a 48 year old male who presents for a video visit. Complains of concerns of possibly COVID. States he visited his children in [place] and son wife was tested + for COVID. Pt has cough and chest congestion. Otherwise no other symptoms such as fever, chills, sob, headache. Has been taking mucinex.       Allergies:  Allergies   Allergen Reactions    Nabumetone Hives       Social History:  Social History     Tobacco Use    Smoking status: Never Smoker    Smokeless tobacco: Former Estate agent Use Topics    Alcohol use: Yes     Comment: 14 wine or beer per week    Drug use: Not Currently         The following sections were reviewed this encounter by the provider:   Tobacco   Allergies   Meds   Problems   Med Hx   Surg Hx   Fam Hx            ROS:  All other 14 point ROS negative except per HPI  General/Constitutional:   Denies Chills. Denies Fatigue. Denies Fever.   ENT:   + Nasal Discharge. Denies Sinus pain. Denies Sore throat.   Respiratory:   + Cough. Denies Shortness of breath. Denies Wheezing.   Cardiovascular:   Denies Chest pain. +chest congestion  Gastrointestinal:   Denies Abdominal pain. Denies Constipation. Denies Diarrhea. Denies Nausea. Denies Vomiting.   Neurologic:   Denies Dizziness. Denies Headache. Denies Tingling/Numbness.     Objective:   Vitals:  There were no vitals taken for this visit.      Physical Exam:  General Examination:   GENERAL APPEARANCE: alert, in no acute distress,  oriented to time, place, and person.     Assessment:       1. Close Exposure to Covid-19 Virus  - COVID-19 (SARS-CoV-2); Future        Plan:      Status:   Mild symptoms with close contact with suspected or  known COVID-19 infected patient  Treatment:    Encouraged supportive management. Optimize H20 hydration, OTC Mucinex DM twice a day as needed and Vitamin C 500mg  twice a day (oxidative support)  Recommendations:   Self-care instructions provided to patient.  Pt instructed to seek immediate medical attention for worsening cough, shortness of breath, symptoms of hypoxia, or confusion.     Time spent in discussion: 10 minutes    Janalyn Harder, MD

## 2018-12-10 LAB — CORONAVIRUS 2 (SARS-COV-2) RNA DETECTION: SARS CoV 2 Overall Result: NOT DETECTED

## 2018-12-11 ENCOUNTER — Telehealth (INDEPENDENT_AMBULATORY_CARE_PROVIDER_SITE_OTHER): Payer: BLUE CROSS/BLUE SHIELD | Admitting: Family

## 2018-12-11 ENCOUNTER — Encounter (INDEPENDENT_AMBULATORY_CARE_PROVIDER_SITE_OTHER): Payer: Self-pay | Admitting: Family Medicine

## 2018-12-11 ENCOUNTER — Encounter (INDEPENDENT_AMBULATORY_CARE_PROVIDER_SITE_OTHER): Payer: Self-pay | Admitting: Family

## 2018-12-11 DIAGNOSIS — R0981 Nasal congestion: Secondary | ICD-10-CM

## 2018-12-11 DIAGNOSIS — Z20822 Contact with and (suspected) exposure to covid-19: Secondary | ICD-10-CM

## 2018-12-11 DIAGNOSIS — Z20828 Contact with and (suspected) exposure to other viral communicable diseases: Secondary | ICD-10-CM

## 2018-12-11 NOTE — Progress Notes (Signed)
Patient is following up on message left on 12/11/2018- No availably for today.    Please advise patient at preferred phone number  [phone]  Thank you

## 2018-12-11 NOTE — Patient Instructions (Signed)
Coronavirus Disease 2019 (COVID-19): Caring for Yourself or Others   If you or a household member have symptoms of COVID-19, follow the guidelines below for preventing spread of the virus, and managing symptoms.   If you think you have COVID-19 symptoms   Stay home. Call your healthcare provider and tell them you have symptoms of COVID-19. Do this before going to any hospital or clinic. Follow your provider's instructions. You may be advised to isolate yourself at home. This is called self-isolation.   Don't panic. Keep in mind that other illnesses can cause similar symptoms.   Stay away from work, school, and public places. Limit physical contact with family members. Limit visitors. Don't kiss anyone or share eating or drinking utensils. Clean surfaces you touch with disinfectant. This is to help prevent the virus from spreading.   If you need to cough or sneeze, do it into a tissue. Then throw the tissue into the trash. If you don't have tissues, cough or sneeze into the bend of your elbow.   Don't share food or personal items with people in your household. This includes items like eating and drinking utensils, towels, and bedding.   Wear a cloth face mask around other people. During a public health emergency, medical face masks may be reserved for healthcare workers. You may need to make a cloth face mask of your own. You can do this using a bandana, T-shirt, or other cloth. The CDC has instructions on how to make a face mask. Wear the mask so that it covers both your nose and mouth.   If you need to go to a hospital or clinic, expect that the healthcare staff will wear protective equipment such as masks, gowns, gloves, and eye protection. You may be put in a separate room. This is to prevent the possible virus from spreading.   Tell the healthcare staff about recent travel. This includes local travel on public transport. Staff may need to find other people you have been in contact with.   Follow all  instructions the healthcare staff give you.    If you have been diagnosed with COVID-19   Stay home and start self-isolation. Don't leave your home unless you need to get medical care. Don't go to work, school, or public areas. Don't use public transportation or taxis.   Follow all instructions from your healthcare provider. Call your healthcare provider's office before going. They can prepare and give you instructions. This will help prevent the virus from spreading.   If you need to go to a hospital or clinic, expect that the healthcare staff will wear protective equipment such as masks, gowns, gloves, and eye protection. You may be put in a separate room. This is to prevent the possible virus from spreading.   Wear a face mask. This is to protect other people from your germs. If you are not able to wear a mask, your caregivers should. During a public health emergency, medical face masks may be reserved for healthcare workers. You may need to make a cloth face mask of your own. You can do this using a bandana, T-shirt, or other cloth. The CDC has instructions on how to make a face mask. Wear the mask so that it covers both your nose and mouth.   Stay away from other people in your home.   Have no contact with pets and animals.   Don't share food or personal items with people in your household. This includes items like eating and   drinking utensils, towels, and bedding.   If you need to cough or sneeze, do it into a tissue. Then throw the tissue into the trash. If you don't have tissues, cough or sneeze into the bend of your elbow.   Wash your hands often.    Self-care at home  There is currently no medicine approved to prevent or treat the virus. Some experimental and other medicines are being tested against COVID-19. Other medicines used to treat other conditions are being looked at for COVID-19, but they are not currently approved to treat it.   Current treatment is mainly aimed at helping your body  while it fights the virus. This is known as supportive care. Take care of yourself at home by:    Getting rest. This helps your body fight the illness.   Staying hydrated.  Drinking liquids is the best way to prevent dehydration. Try to drink 6 to 8 glasses of liquids every day, or as advised by your provider. Also check with your provider about which fluids are best for you. Don't drink fluids that contain caffeine or alcohol.   Taking over-the-counter (OTC) pain medicine. These are used to help ease pain and reduce fever. Follow your healthcare provider's instructions for which OTC medicine to use.  If you've been in the hospital for suspected or confirmed COVID-19 and now are home, follow all of your healthcare team's instructions. This will include when it's OK to stop self-isolation. You may also get instructions on position changes to help your breathing, such as lying on your belly (prone positioning).   If you've had confirmed COVID-19, your healthcare team may ask you to consider donating your plasma. This is called COVID-19 convalescent plasma donation. Plasma from people fully recovered from COVID-19 may contain antibodies to help fight COVID-19 in people who are currently seriously ill with the disease. Experts don't know the safety of COVID-19 convalescent plasma or how well it works. Research continues. The FDA has approved it for emergency use in certain people with serious or life-threatening COVID-19.   Caring for a sick person   Follow all instructions from healthcare staff.   Wash your hands often.   Wear protective clothing as advised.   Make sure the sick person wears a mask. If they can't wear a mask, don't stay in the same room with the person. If you must be in the same room, wear a face mask. When wearing a mask, make sure that it covers both the nose and mouth.   Keep track of the sick person's symptoms.   Clean home surfaces often with disinfectant. This includes phones, kitchen  counters, fridge door handle, bathroom surfaces, and others.   Don't let anyone share household items with the sick person. This includes eating and drinking tools, towels, sheets, or blankets.   Clean fabrics and laundry thoroughly.   Keep other people and pets away from the sick person.    When you can stop self-isolation  When you are sick with COVID-19, you should stay away from other people. This is called self-isolation.   Your limits are different if you've had COVID-19 in the last 3 months but are fully recovered without symptoms and you have been exposed to someone with COVID-19. If you are symptom-free, you don't need to stay home away from others or be retested. The CDC doesn't recommend retesting unless you have symptoms of COVID-19 and your new symptoms can't be linked to another illness. Contact your healthcare provider if you   have any questions. If you develop symptoms, stay home. If you had COVID-19 over 3 months ago and have been exposed again, treat it like you've never had COVID-19 and stay home, limit your contact with others, call your provider, and monitor for symptoms.   If you are normally healthy, the CDC does not advise retesting for COVID-19 with nose-throat swabs. You can stop self-isolation when all 3 of these are true:   1. You have had no fever for at least 24 hours. This means no fever without medicine that reduces fever, such as acetaminophen, for at least 24 hours.  2. Your symptoms such as cough or trouble breathing have improved.  3. It has been at least 10 days since your first symptoms started.  Talk with your healthcare provider before you leave home. Tell them if the 3 things above are true for you. They may tell you it's OK to leave home. In some cases, your state or local area may have specific advice. Your healthcare provider will tell you more.   If you have a weak immune system and COVID-19, or if you've had severe COVID-19, your instructions on when to stop isolation  will be somewhat different. Some conditions and treatments can cause a weak immune system. These include cancer treatment, bone marrow or organ transplants, and conditions such as HIV or other immune system disorders. Follow your healthcare provider's instructions on how to isolate and when it's OK to stop. You may be advised to extend your isolation and you could be retested for COVID-19. Generally, you will be told to stay in home isolation until all 3 of these are true:   1. You have no fever without fever-reducing medicines.  2. Your breathing symptoms such as cough and shortness of breath have improved.  3. You have 2 negative COVID-19 nose-throat swabs that were collected at least 24 hours apart. If no tests are available, your healthcare provider will likely tell you to follow the isolation instructions for normally healthy people. Follow your provider's instructions on isolation and when it's OK to stop.  When you return to public settings  When you are well enough to go outside your home, consider the CDC's guidance on cloth face masks:      The CDC advises all people over age 2 to wear cloth face masks in public settings when around people outside of their household, especially when it's hard to socially distance. For example, wear a face mask in populated places such as public transit, public protests and marches, and crowded stores, bars, and restaurants.   Cloth masks may help prevent people who have COVID-19 form spreading the virus to others.   Cloth masks are most likely to reduce COVID-19 spread when masks are widely used by people who are out in the public.    Certain people should not wear a face covering. This includes:    Children younger than 2 years old   Anyone with a health, developmental, or mental health condition that can be made worse by wearing a mask   Anyone who is unconscious or unable to remove the face covering without help. See the CDC's guidance on who should not wear a  face mask.    When to call your healthcare provider  Call your healthcare provider right away if a sick person has any of these:    Trouble breathing   Pain or pressure in chest  If a sick person has any of these, call 911:     Trouble breathing that gets worse   Pain or pressure in chest that gets worse   Blue tint to lips or face   Fast or irregular heartbeat   Confusion or trouble waking   Fainting or loss of consciousness   Coughing up blood  Going home from the hospital   If you were diagnosed with COVID-19 and were recently discharged from the hospital:    Follow the instructions above for self-care and isolation.   Follow the hospital healthcare team's specific instructions.   Ask questions if anything is unclear to you. Write down answers so you remember them.  Date last modified: 11/13/2018  [employer] last reviewed this educational content on 06/21/2018   2000-2020 [employer] [address], [place] [address]. All rights reserved. This information is not intended as a substitute for professional medical care. Always follow your healthcare professional's instructions.

## 2018-12-11 NOTE — Addendum Note (Signed)
Addended by: Ronaldo Miyamoto on: 12/11/2018 02:03 PM     Modules accepted: Orders

## 2018-12-11 NOTE — Progress Notes (Signed)
[facility]    PROGRESS NOTE      Patient: Michael Jones   Date: 12/11/2018   MRN: [id number]     Past Medical History:   Diagnosis Date    Hypertension      Social History     Socioeconomic History    Marital status: Married     Spouse name: Not on file    Number of children: Not on file    Years of education: Not on file    Highest education level: Not on file   Occupational History    Not on file   Social Needs    Financial resource strain: Not on file    Food insecurity     Worry: Not on file     Inability: Not on file    Transportation needs     Medical: Not on file     Non-medical: Not on file   Tobacco Use    Smoking status: Never Smoker    Smokeless tobacco: Former Neurosurgeon   Substance and Sexual Activity    Alcohol use: Yes     Comment: 14 wine or beer per week    Drug use: Not Currently    Sexual activity: Yes     Partners: Female     Birth control/protection: None   Lifestyle    Physical activity     Days per week: Not on file     Minutes per session: Not on file    Stress: Not on file   Relationships    Social connections     Talks on phone: Not on file     Gets together: Not on file     Attends religious service: Not on file     Active member of club or organization: Not on file     Attends meetings of clubs or organizations: Not on file     Relationship status: Not on file    Intimate partner violence     Fear of current or ex partner: Not on file     Emotionally abused: Not on file     Physically abused: Not on file     Forced sexual activity: Not on file   Other Topics Concern    Not on file   Social History Narrative    Not on file     Family History   Problem Relation Age of Onset    Hypertension Brother     Lung cancer Mother     Cancer Father          MEDICATIONS     Outpatient Medications Marked as Taking for the 12/11/18 encounter (Telemedicine Visit) with Ronaldo Miyamoto, FNP   Medication Sig Dispense Refill    Ascorbic Acid (VITAMIN C) 100 MG tablet Take 100 mg by  mouth daily      fexofenadine-pseudoephedrine (ALLEGRA-D 24) 180-240 MG per 24 hr tablet Take 1 tablet by mouth daily      lisinopril (ZESTRIL) 20 MG tablet Take 1.5 tablets (30 mg total) by mouth 2 (two) times daily 270 tablet 0    meloxicam (MOBIC) 7.5 MG tablet Take 7.5 mg by mouth 2 (two) times daily           Allergies   Allergen Reactions    Nabumetone Hives       SUBJECTIVE     Michael Jones is a 48 y.o. male    Chief Complaint   Patient presents with    COVID  exposure       HPI  COVID exposure   Patient is a 48 year old male who presents via telemedicine visit today due to exposure to COVID.  He was tested for COVID on 9/17, negative results. Wife is + for COVID, she was tested 12/06/18. Her symptoms are sinus pressure, congestion, rhinorrhea, post nasal drainage, mild fatigue, headache but denies body aches fever. Her symptoms started 11/25/18. Wife works at [employer] in [place].     Was exposed to son who was tested positive on 9/16 as well.  He did not have any symptoms but did have exposure to somebody who was positive and that is why he was tested.    Patient admits that he had a "chest cold" on 8/31/20to include cough with mucus production. Is feeling better from those symptoms. Does notice that he now has nasal congestion and post nasal drainage that started 1-2 days ago. Denies loss of sense of taste or smell.  Denies fever, body aches, headache, GU or GI habit changes, abdominal pain, nausea or vomiting.    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 Pt states he would like to get tested again so he knows for sure so he does not expose the public.     ROS     Review of Systems   Constitutional: Negative.    HENT: Positive for congestion and postnasal drip. Negative for ear pain, rhinorrhea, sinus pressure, sinus pain, sneezing, sore throat, tinnitus, trouble swallowing and voice change.    Eyes: Negative.    Respiratory: Positive for cough (lingering, greatly improved.). Negative  for apnea, choking, chest tightness, shortness of breath, wheezing and stridor.    Cardiovascular: Negative.    Gastrointestinal: Negative.    Musculoskeletal: Negative.    Skin: Negative.    Allergic/Immunologic: Positive for environmental allergies.   Neurological: Negative.        The following sections were reviewed this encounter by the provider:   Tobacco   Allergies   Meds   Problems   Med Hx   Surg Hx   Fam Hx            ASSESSMENT/PLAN        Physical Exam  Constitutional:       General: He is not in acute distress.     Appearance: Normal appearance. He is not ill-appearing or toxic-appearing.   HENT:      Head: Normocephalic and atraumatic.   Pulmonary:      Effort: Pulmonary effort is normal.   Neurological:      Mental Status: He is alert.         1. Nasal congestion  - COVID-19 (SARS-CoV-2) [facility]; Future    2. Close Exposure to Covid-19 Virus  - COVID-19 (SARS-CoV-2) [facility]; Future         Advised patient that repeat testing is not recommended.  However patient is symptomatic and due to close exposure, he would like testing to be ordered. Order placed for pt, advised to continue OTC allergy medication. Cough should continue to resolve, if any concerns, please seek medical attention. Advise pt to continue to quarantine for the next two weeks. Pt is agreeable with plan of care as discussed. May contact me/clinical staff with any questions/concerns. All questions answered today.    Verbal consent has been obtained from the patient to conduct a telephone and/or video visit to minimize exposure to COVID-19: Yes.    Signed,  Ronaldo Miyamoto, FNP  12/11/2018

## 2018-12-11 NOTE — Addendum Note (Signed)
Addended byNicki Guadalajara, Suzy Kugel L on: 12/11/2018 01:53 PM     Modules accepted: Orders

## 2018-12-12 ENCOUNTER — Telehealth (INDEPENDENT_AMBULATORY_CARE_PROVIDER_SITE_OTHER): Payer: BLUE CROSS/BLUE SHIELD | Admitting: Family

## 2018-12-19 ENCOUNTER — Telehealth (INDEPENDENT_AMBULATORY_CARE_PROVIDER_SITE_OTHER): Payer: Self-pay | Admitting: Family

## 2018-12-19 NOTE — Telephone Encounter (Signed)
Spoke with patient who states his chest cold is resolving.  He did not get the COVID test done.  This is okay, remember to practice universal precautions as discussed.  Patient has no further questions at this time.  Advised patient to seek medical attention if cough or cold symptoms persist, or any concerns.Michael Jones

## 2018-12-20 ENCOUNTER — Encounter (INDEPENDENT_AMBULATORY_CARE_PROVIDER_SITE_OTHER): Payer: Self-pay | Admitting: Family

## 2018-12-22 ENCOUNTER — Ambulatory Visit (INDEPENDENT_AMBULATORY_CARE_PROVIDER_SITE_OTHER): Payer: BLUE CROSS/BLUE SHIELD

## 2018-12-22 ENCOUNTER — Ambulatory Visit (FREE_STANDING_LABORATORY_FACILITY): Payer: BLUE CROSS/BLUE SHIELD | Admitting: Nurse Practitioner

## 2018-12-22 VITALS — BP 156/98 | HR 70 | Temp 97.9°F | Ht 70.0 in | Wt 195.0 lb

## 2018-12-22 DIAGNOSIS — Z20822 Contact with and (suspected) exposure to covid-19: Secondary | ICD-10-CM

## 2018-12-22 DIAGNOSIS — R062 Wheezing: Secondary | ICD-10-CM

## 2018-12-22 DIAGNOSIS — Z20828 Contact with and (suspected) exposure to other viral communicable diseases: Secondary | ICD-10-CM

## 2018-12-22 MED ORDER — GUAIFENESIN-CODEINE 100-10 MG/5ML PO SOLN
5.00 mL | Freq: Every evening | ORAL | 0 refills | Status: AC | PRN
Start: 2018-12-22 — End: 2019-02-20

## 2018-12-22 MED ORDER — ALBUTEROL SULFATE HFA 108 (90 BASE) MCG/ACT IN AERS
2.0000 | INHALATION_SPRAY | RESPIRATORY_TRACT | 0 refills | Status: AC | PRN
Start: 2018-12-22 — End: 2019-02-20

## 2018-12-22 MED ORDER — AZITHROMYCIN 250 MG PO TABS
ORAL_TABLET | ORAL | 0 refills | Status: AC
Start: 2018-12-22 — End: 2018-12-27

## 2018-12-22 MED ORDER — BUDESONIDE-FORMOTEROL FUMARATE 80-4.5 MCG/ACT IN AERO
2.00 | INHALATION_SPRAY | Freq: Two times a day (BID) | RESPIRATORY_TRACT | 1 refills | Status: DC
Start: 2018-12-22 — End: 2020-11-06

## 2018-12-22 NOTE — Progress Notes (Signed)
[facility]  PROGRESS NOTE     Patient: Michael Jones   Date of Service: 12/22/2018   MRN: [id number]       Michael Jones is a 48 y.o. male with c/o chest congestion, moist productive cough . Pain right side with cough. Usually able to bike long distance. With underlying seasonal allergies.   Wife had positive covid 2 weeks ago. Patient states he does not feel SOB, No H/A chest pain abdominal pain n/v/d       No travel     HISTORY     Chief Complaint   Patient presents with    Flu like symptoms     Chest congestion, productive cough with mucus production, runny nose, post nasal gtt x 1 mth.  Right lower rib starte becoming painful when coughing 7/10 sharp.  Occupation: special agent.  Last test for covid 9/12 negative.  OTC: Mucinex DM taken today at 7:30a        HPI    Review of Systems   Constitutional: Negative for appetite change, chills, diaphoresis and fever.   HENT: Positive for congestion, postnasal drip and rhinorrhea. Negative for ear pain, hearing loss, sinus pressure, sore throat and trouble swallowing.    Eyes: Negative for discharge.   Respiratory: Positive for cough. Negative for chest tightness, shortness of breath and wheezing.    Cardiovascular: Negative for chest pain.   Gastrointestinal: Negative for abdominal pain, diarrhea, nausea and vomiting.   Musculoskeletal: Negative for arthralgias, myalgias, neck pain and neck stiffness.   Skin: Negative for rash.   Allergic/Immunologic: Negative for environmental allergies.   Neurological: Negative for dizziness and headaches.   Hematological: Negative for adenopathy.   Psychiatric/Behavioral: Negative for confusion and sleep disturbance.   All other systems reviewed and are negative.      History:  Past Medical History:   Diagnosis Date    Hypertension        Past Surgical History:   Procedure Laterality Date    KNEE SURGERY      KNEE SURGERY  02/2017       Family History   Problem Relation Age of Onset    Hypertension Brother     Lung  cancer Mother     Cancer Father        Social History     Tobacco Use    Smoking status: Never Smoker    Smokeless tobacco: Former Neurosurgeon   Substance Use Topics    Alcohol use: Yes     Comment: 14 wine or beer per week    Drug use: Not Currently       History reviewed.        Current Outpatient Medications:     Ascorbic Acid (VITAMIN C) 100 MG tablet, Take 100 mg by mouth daily, Disp: , Rfl:     dextromethorphan-guaiFENesin (MUCINEX DM) 30-600 MG per 12 hr tablet, Take 1 tablet by mouth every 12 (twelve) hours, Disp: , Rfl:     fexofenadine-pseudoephedrine (ALLEGRA-D 24) 180-240 MG per 24 hr tablet, Take 1 tablet by mouth daily, Disp: , Rfl:     lisinopril (ZESTRIL) 20 MG tablet, Take 1.5 tablets (30 mg total) by mouth 2 (two) times daily, Disp: 270 tablet, Rfl: 0    meloxicam (MOBIC) 7.5 MG tablet, Take 7.5 mg by mouth 2 (two) times daily, Disp: , Rfl:     albuterol sulfate HFA (Proventil HFA) 108 (90 Base) MCG/ACT inhaler, Inhale 2 puffs into the lungs every 4 (  four) hours as needed for Wheezing or Shortness of Breath, Disp: 1 Inhaler, Rfl: 0    azithromycin (ZITHROMAX) 250 MG tablet, Take 2 tabs daily on day 1, then 1 tablet daily for 4 days., Disp: 6 tablet, Rfl: 0    budesonide-formoterol (Symbicort) 80-4.5 MCG/ACT inhaler, Inhale 2 puffs into the lungs 2 (two) times daily, Disp: 10.2 Inhaler, Rfl: 1    cetirizine (ZYRTEC) 10 MG tablet, Take 10 mg by mouth daily, Disp: , Rfl:     guaiFENesin-codeine (ROBITUSSIN w Codeine) 100-10 MG/5ML syrup, Take 5 mLs by mouth nightly as needed for Cough, Disp: 120 mL, Rfl: 0    Allergies   Allergen Reactions    Nabumetone Hives       Medications and Allergies reviewed.    PHYSICAL EXAM     Vitals:    12/22/18 1352   BP: (!) 156/98   Pulse: 70   Temp: 97.9 F (36.6 C)   TempSrc: Tympanic   SpO2: 98%   Weight: 88.5 kg (195 lb)   Height: 1.778 m (5\' 10" )       Physical Exam   Nursing note and vitals reviewed.  Constitutional: He is oriented to person, place, and  time. He appears well-developed and well-nourished.  Non-toxic appearance. No distress.   HENT:   Head: Normocephalic and atraumatic.   Right Ear: Tympanic membrane and external ear normal.   Left Ear: Tympanic membrane and external ear normal.   Nose: Mucosal edema present. Right sinus exhibits no frontal sinus tenderness. Left sinus exhibits no frontal sinus tenderness.   Mouth/Throat: Uvula is midline. Posterior oropharyngeal erythema present. No oropharyngeal exudate.   Eyes: Conjunctivae and lids are normal. Right conjunctiva is not injected. Left conjunctiva is not injected. No scleral icterus.   Neck: Normal range of motion. Neck supple.   Cardiovascular: Normal rate, regular rhythm, S1 normal, S2 normal and normal heart sounds.   No murmur heard.  Pulmonary/Chest: Effort normal. No accessory muscle usage. No tachypnea. No respiratory distress. He has no decreased breath sounds. He has wheezes in the right middle field, the right lower field, the left middle field and the left lower field. He has rhonchi in the right middle field and the right lower field.   Abdominal: Normal appearance. There is no abdominal tenderness.   Lymphadenopathy:     He has cervical adenopathy.        Right cervical: Superficial cervical adenopathy present.        Left cervical: Superficial cervical adenopathy present.   Neurological: He is alert and oriented to person, place, and time.   Skin: Skin is warm, dry and intact. No rash noted. He is not diaphoretic. No pallor.   Psychiatric: He has a normal mood and affect.         UCC COURSE       Results     ** No results found for the last 24 hours. **            X-ray Chest Pa And Lateral    Result Date: 12/22/2018  XR CHEST 2 VIEWS 12/22/2018 2:12 PM Clinical history: wheezing cough x1 month. Comparison: none available. Findings: There is no infiltrate, effusion, consolidation or pneumothorax. The cardiac size is within normal limits. The thoracic aorta is unremarkable.       Unremarkable 2 views of the chest. Joselyn Glassman, MD  12/22/2018 2:25 PM        Orders Placed This Encounter   Medications  albuterol sulfate HFA (Proventil HFA) 108 (90 Base) MCG/ACT inhaler     Sig: Inhale 2 puffs into the lungs every 4 (four) hours as needed for Wheezing or Shortness of Breath     Dispense:  1 Inhaler     Refill:  0    azithromycin (ZITHROMAX) 250 MG tablet     Sig: Take 2 tabs daily on day 1, then 1 tablet daily for 4 days.     Dispense:  6 tablet     Refill:  0    budesonide-formoterol (Symbicort) 80-4.5 MCG/ACT inhaler     Sig: Inhale 2 puffs into the lungs 2 (two) times daily     Dispense:  10.2 Inhaler     Refill:  1    guaiFENesin-codeine (ROBITUSSIN w Codeine) 100-10 MG/5ML syrup     Sig: Take 5 mLs by mouth nightly as needed for Cough     Dispense:  120 mL     Refill:  0         PROCEDURES     Procedures       ASSESSMENT     Encounter Diagnoses   Name Primary?    Suspected 2019-nCoV infection Yes    Wheezing           SSESSMENT    PLAN      Viral PNA suspected also may have asthma but not diagnosed . He had significant wheezing on exam, given rescue inhaler and long acting symbicort. Z pak for suspected PNA     Discussed results and diagnosis with patient/family.  Reviewed warning signs for worsening condition, as well as, indications for follow-up with pmd and return to urgent care clinic.   Patient/family expressed understanding of instructions.    Orders Placed This Encounter   Procedures    COVID-19 (SARS-CoV-2) Pre-Procedure, Team Member/First Responder    X-ray chest PA and lateral         An After Visit Summary was printed and given to the patient.      Signed,  Hinton Lovely, NP

## 2018-12-22 NOTE — Patient Instructions (Signed)
Asthma (Adult)   Asthma is a disease where the medium andsmall air passages in the lung go into spasm and restrict air flow. Inflammation and swelling of the airways cause further blockage. During an acute asthma attack, these factors cause trouble breathing, wheezing, cough, and chest tightness.     An asthma attack can be triggered by many things. Common triggers include infections such as the common cold, bronchitis, and pneumonia. Irritants such as smoke or pollutants in the air, very cold air, emotional upset, and exercise can also trigger an attack. Inmany adults with asthma, allergies todust, mold, pollen, and animal dander can cause an asthma attack. Skipping doses of daily asthma medicine can also bring on an asthma attack.   Asthma can be controlled using thecorrect medicines prescribed by your healthcare provider and staying away from known triggers including allergens and irritants.   Home care   Take prescribed medicine exactly at the times advised. If you need medicine such as from a handheld inhaler or aerosol breathing machine more than every 4 hours, contact your healthcare provider or get medical care right away. If you are prescribed an antibiotic or prednisone, take all of the medicine as prescribed. Keep taking it even if you are feeling better after a few days.   Don't smoke. Stay away from the smoke of others.   Some people with asthma find their symptoms get worse when they take aspirin and non-steroidal or fever-reducing medicines such as ibuprofen and naproxen. Talk with your healthcare provider if you think this may apply to you.    Follow-up care  Follow up with your healthcare provider, or as advised. Always bring all of your current medicines to any appointments with your healthcare provider. Also bring a complete list of medicines, eventhose not taken for asthma. If you don't already have one, talk with your healthcare provider about making your  own Asthma Action Plan.   A pneumonia (pneumococcal)vaccine and yearly flu shot (every fall) are advised. Ask your provider about this.   When to get medical advice  Call your healthcare provider or get medical care right away if any of these occur:    More wheezing or shortness of breath   Need to use your inhalers more often than normal without relief   Fever of 100.55F (38C) or higher, or as directed by your provider   Coughing up lots of dark-colored or bloody sputum (mucus)   Chest pain with each breath   If you use a peak flow meter as part of an Asthma Action Plan, and you are still in the yellow zone (50% to 80%) 15 minutes after using inhaler medicine.  Call 911  Call 911 if any of these occur:    Trouble walking or talking because you are short of breath   If you use a peak flow meter as part of an Asthma Action Plan, andyou are still in the red zone (less than 50%) 15 minutes after using inhaler medicine   Lips or fingernails turn gray, purple, or blue   Feeling faint or loss of consciousness  [employer] last reviewed this educational content on 12/20/2017   2000-2020 [employer], [place]. [address], [place], [place] [address]. All rights reserved. This information is not intended as a substitute for professional medical care. Always follow your healthcare professional's instructions.      Follow-up Steps for Patients with Pending COVID-19 Testing - 09/29/2018     Thank you for choosing [facility]. During  your visit you received a test for COVID-19 and the test performed takes at least 3-5 days to result. The goal for results is 3-5 days, however with the current increase in testing volumes, tests can take up to 10 days for results to be ready. We appreciate your patience.      What should I do while I am waiting for my test results?    Take good care of yourself and be mindful of the safety of those around you:    Rest and stay well hydrated.    Use acetaminophen  (Tylenol) to help manage fever.    Wash your hands often.    Stay at home except to get medical care.    Call ahead before seeing your doctor, any other medical provider, or seeking medical care at any healthcare facility.    Isolate yourself as much as possible.    Clean all "high touch" surfaces every day.    Most people with COVID-19 infection will get better; however, a small number will develop worsening infection which may require more intensive treatment. Please contact your doctor or present to the nearest Emergency Department if you develop the warning signs of more severe infection such as:   o A fever over 102 not controlled by acetaminophen (Tylenol)   o Shortness of breath or chest pain   o Worsening cough   o Increasing weakness   o Inability to tolerate oral fluids    Some general things to think about:   o If you have a medical emergency and need to call 911, notify the person answering the phone that you have, or are being evaluated for COVID-19. If possible, put on a facemask before the ambulance arrives.   o Persons who are placed under active monitoring or facilitated self-monitoring should follow instructions provided by their local health department or occupational health professionals, as appropriate    When will I know the results of my COVID test?     After a minimum of 3-5 days has passed, there are multiple ways in which you can be informed about the results of your COVID 19 test results. Please ensure we have your most up to date contact information on file before you leave your appointment.     MyChart  you may be notified of results through your MyChart account. The easiest way to find all test results done at an [facility] facility is through your MyChart account, [facility] online patient portal. This is also an easy way to connect with your [facility] primary care team for continued care, if this applies to you. If you do not already have   MyChart activated, you will be given an  activation code at the time of testing. Please note that for children age 34-17 MyChart is not available.     You may receive a call from your Primary Care Physician or the physician who ordered the test with the results. You may also receive a call from [facility].   If none of the options above has helped you obtain your results, or you have additional questions, please contact the COVID 19 Notifications Call Center for further instructions at [phone], open 6 days a week, Monday-Saturday from 8:30am-5:00pm.     Again, the goal for results is 3-5 days, however with the current increase in testing volumes, tests can take up to 10 days for results to be ready. We appreciate your patience.     If you need help activating your MyChart  account, please let us know. You can also download the MyChart application to your phone using the QR code below.         When can I stop isolation?    Until you receive your results, you should remain home and avoid contact with others (self-isolate).The decision to stop isolation precautions should be made on a case-by-case basis, in consultation with healthcare providers and state and local health departments.     Here is some general advice:      If your test result is negative, you should stay home until you do not have a fever (without the use of fever reducing medication) or any respiratory symptoms for at least 3 days.      If your test result is positive, you should stay home for at least 10 days from the day your symptoms started. If you are still having symptoms at the end of 10 days, continue in-home isolation until 3 days after your symptoms stop. Talk to your healthcare provider to determine what follow-up is needed. You can also schedule telemedicine visits with your primary care physician to discuss ongoing symptoms or new concerns that may arise.     For further information on what you and others in your household should do please visit the Center for Disease Control  (CDC) website @ [employer]si.html     If you are ill and need to be seen by a healthcare provider, and your primary physician is not available, the [facility] are open for walk in visits.     [facility] - open M - F 8 am to 8 pm at the following locations:     [facility]: [address] [place], [place] [address]     [facility]: [address] [place], [place] [address]     Open Daily 8am  8pm including weekends at the following location:     [facility]: [address] [place], [place] [address]     In addition, all [facility] Emergency Departments can care for patients with all conditions, both COVID and non-COVID. Do not hesitate to seek care should you have a healthcare emergency.

## 2018-12-23 LAB — COVID-19 (SARS-COV-2): SARS CoV 2 Overall Result: NOT DETECTED

## 2018-12-30 ENCOUNTER — Other Ambulatory Visit (INDEPENDENT_AMBULATORY_CARE_PROVIDER_SITE_OTHER): Payer: Self-pay | Admitting: Internal Medicine

## 2019-01-08 ENCOUNTER — Other Ambulatory Visit (INDEPENDENT_AMBULATORY_CARE_PROVIDER_SITE_OTHER): Payer: Self-pay | Admitting: Internal Medicine

## 2019-01-08 DIAGNOSIS — I10 Essential (primary) hypertension: Secondary | ICD-10-CM

## 2019-01-19 ENCOUNTER — Encounter (INDEPENDENT_AMBULATORY_CARE_PROVIDER_SITE_OTHER): Payer: Self-pay | Admitting: Internal Medicine

## 2019-01-19 ENCOUNTER — Ambulatory Visit (INDEPENDENT_AMBULATORY_CARE_PROVIDER_SITE_OTHER): Payer: BLUE CROSS/BLUE SHIELD | Admitting: Internal Medicine

## 2019-01-19 VITALS — BP 122/87 | HR 71 | Temp 97.2°F | Resp 16 | Ht 70.0 in | Wt 206.6 lb

## 2019-01-19 DIAGNOSIS — Z23 Encounter for immunization: Secondary | ICD-10-CM

## 2019-01-19 DIAGNOSIS — I10 Essential (primary) hypertension: Secondary | ICD-10-CM

## 2019-01-19 MED ORDER — LISINOPRIL 20 MG PO TABS
20.0000 mg | ORAL_TABLET | Freq: Two times a day (BID) | ORAL | 1 refills | Status: DC
Start: 2019-01-19 — End: 2019-05-07

## 2019-01-19 NOTE — Progress Notes (Signed)
Verified flu vaccine by [id number].

## 2019-01-19 NOTE — Patient Instructions (Signed)
It was a pleasure seeing you at the office today!      Controlling High Blood Pressure  High blood pressure (hypertension) is often called the silent killer. This is because many people who have it, don't know it. It can be very dangerous. High blood pressure can raise your risk of heart attack, stroke, heart disease, and heart failure. Controlling your blood pressure can decrease your risk of these problems. It's important to know the appropriate blood pressure range and remember to check your blood pressure regularly. Doing so can save your life.  Blood pressure measurements are given as 2 numbers. Systolic blood pressure is the upper number. This is the pressure when the heart contracts. Diastolic blood pressure is the lower number. This is the pressure when the heart relaxes between beats.  Blood pressure is categorized as normal, elevated, or stage 1 or stage 2 high blood pressure:   Normal blood pressure is systolic of less than 120 and diastolic of less than 80 (120/80)   Elevated blood pressure is systolic of 120 to 129 and diastolic less than 80   Stage 1 high blood pressure is systolic of 130 to 139 or diastolic between 80 to 89   Stage 2 high blood pressure is when systolic is 140 or higher or the diastolic is 90 or higher  A heart-healthy lifestyle can help you control your blood pressure without medicines. Here are some things you can do to pursue a heart-healthy lifestyle:    Choose heart-healthy foods   Select low-salt, low-fat foods. Limit sodium intake to 2,400 mg per day or the amount suggested by your healthcare provider.   Limit canned, dried, cured, packaged, and fast foods. These can contain a lot of salt.   Eat 8 to 10 servings of fruits and vegetables every day.   Choose lean meats, fish, or chicken.   Eat whole-grain pasta, brown rice, and beans.   Eat 2 to 3 servings of low-fat or fat-free dairy products.   Ask your doctor about the DASH eating plan. This plan helps reduce  blood pressure.   When you go to a restaurant, ask that your meal be prepared with no added salt.    Stay at a healthy weight   Ask your healthcare provider how many calories to eat a day. Then stick to that number.   Ask your healthcare provider what weight range is healthiest for you. If you are overweight, a weight loss of only 3% to 5% of your body weightcan help lower blood pressure. Generally, a good weight loss goal is to lose 10% of your body weight in a year.   Limit snacks and sweets.   Get regular exercise.    Get up and get active   Find activities you enjoy that can be done alone or with friends or family. Such activities might include bicycling, dancing, walking, or jogging.   Park farther away from building entrances to walk more.   Use stairs instead of the elevator.   When you can, walk or bike instead of driving.   Rake leaves, garden, or do household repairs.   Be active at a moderate to vigorous level of physical activity for at least 40 minutes for a minimum of 3 to 4 days a week.    Manage stress   Make time to relax and enjoy life. Find time to laugh.   Communicate your concerns with your loved ones and your healthcare provider.   Visit with family and   friends, and keep up with hobbies.    Limit alcohol and quit smoking   Men should have no more than 2 drinks per day.   Women should have no more than 1 drink per day.   Talk with your healthcare provider about quitting smoking. Smoking significantly increases your risk for heart disease and stroke. Ask your healthcare provider about community smoking cessation programs and other options.    Medicines  If lifestyle changes aren't enough, your healthcare provider may prescribe high blood pressure medicine. Take all medicines as prescribed. If you have any questions about your medicines, ask your healthcare provider before stopping or changing them.  [employer] last reviewed this educational content on 08/20/2017   2000-2020 [employer]  [employer] [address], [place] [address]. All rights reserved. This information is not intended as a substitute for professional medical care. Always follow your healthcare professional's instructions.

## 2019-01-19 NOTE — Progress Notes (Signed)
Have you seen any specialists/other providers since your last visit with us?    No    Arm preference verified?   Yes    The patient is due for influenza vaccine

## 2019-01-19 NOTE — Progress Notes (Signed)
Patient presented to the office for flu vaccine administration.  Received injection in the Left arm.  No reaction was noted and patient left in good condition.
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
## 2019-01-22 ENCOUNTER — Other Ambulatory Visit: Payer: Self-pay | Admitting: Orthopaedic Surgery

## 2019-01-22 DIAGNOSIS — M25511 Pain in right shoulder: Secondary | ICD-10-CM

## 2019-02-08 ENCOUNTER — Ambulatory Visit
Admission: RE | Admit: 2019-02-08 | Discharge: 2019-02-08 | Disposition: A | Discharge status: Home / Self Care | Payer: BLUE CROSS/BLUE SHIELD | Source: Ambulatory Visit | Attending: Orthopaedic Surgery | Admitting: Orthopaedic Surgery

## 2019-02-08 DIAGNOSIS — M25511 Pain in right shoulder: Secondary | ICD-10-CM

## 2019-02-08 DIAGNOSIS — M75111 Incomplete rotator cuff tear or rupture of right shoulder, not specified as traumatic: Secondary | ICD-10-CM | POA: Insufficient documentation

## 2019-02-08 MED ORDER — LIDOCAINE HCL 1 % IJ SOLN
INTRAMUSCULAR | Status: DC
Start: 2019-02-08 — End: 2019-02-08
  Filled 2019-02-08: qty 20

## 2019-02-08 MED ORDER — GADOBUTROL 1 MMOL/ML IV SOLN
INTRAVENOUS | Status: AC
Start: 2019-02-08 — End: 2019-02-08
  Administered 2019-02-08: 11:00:00 0.1 mL via INTRA_ARTICULAR
  Filled 2019-02-08: qty 7.5

## 2019-02-08 MED ORDER — IOHEXOL 300 MG/ML IJ SOLN
INTRAMUSCULAR | Status: AC
Start: 2019-02-08 — End: 2019-02-08
  Administered 2019-02-08: 11:00:00 20 mL via INTRA_ARTICULAR
  Filled 2019-02-08: qty 20

## 2019-02-08 MED ORDER — SODIUM CHLORIDE (PF) 0.9 % IJ SOLN
20.00 mL | INTRAMUSCULAR | Status: DC | PRN
Start: 2019-02-08 — End: 2019-02-09
  Administered 2019-02-08: 20 mL
  Filled 2019-02-08: qty 20

## 2019-02-25 ENCOUNTER — Ambulatory Visit (INDEPENDENT_AMBULATORY_CARE_PROVIDER_SITE_OTHER): Payer: BLUE CROSS/BLUE SHIELD

## 2019-02-25 DIAGNOSIS — Z01818 Encounter for other preprocedural examination: Secondary | ICD-10-CM

## 2019-02-25 DIAGNOSIS — Z1159 Encounter for screening for other viral diseases: Secondary | ICD-10-CM

## 2019-02-26 LAB — COVID-19 (SARS-COV-2): SARS CoV 2 Overall Result: NOT DETECTED

## 2019-04-24 ENCOUNTER — Encounter (INDEPENDENT_AMBULATORY_CARE_PROVIDER_SITE_OTHER): Payer: Self-pay

## 2019-04-25 ENCOUNTER — Ambulatory Visit: Payer: BLUE CROSS/BLUE SHIELD

## 2019-05-06 ENCOUNTER — Other Ambulatory Visit (INDEPENDENT_AMBULATORY_CARE_PROVIDER_SITE_OTHER): Payer: Self-pay | Admitting: Internal Medicine

## 2019-05-06 DIAGNOSIS — I10 Essential (primary) hypertension: Secondary | ICD-10-CM

## 2019-06-04 ENCOUNTER — Encounter (INDEPENDENT_AMBULATORY_CARE_PROVIDER_SITE_OTHER): Payer: Self-pay | Admitting: Internal Medicine

## 2019-07-05 ENCOUNTER — Other Ambulatory Visit (INDEPENDENT_AMBULATORY_CARE_PROVIDER_SITE_OTHER): Payer: Self-pay | Admitting: Internal Medicine

## 2019-07-27 ENCOUNTER — Encounter (INDEPENDENT_AMBULATORY_CARE_PROVIDER_SITE_OTHER): Payer: Self-pay | Admitting: Internal Medicine

## 2019-07-30 ENCOUNTER — Encounter (INDEPENDENT_AMBULATORY_CARE_PROVIDER_SITE_OTHER): Payer: Self-pay | Admitting: Internal Medicine

## 2019-09-06 ENCOUNTER — Encounter (INDEPENDENT_AMBULATORY_CARE_PROVIDER_SITE_OTHER): Payer: Self-pay | Admitting: Internal Medicine

## 2019-09-10 ENCOUNTER — Encounter (INDEPENDENT_AMBULATORY_CARE_PROVIDER_SITE_OTHER): Payer: BLUE CROSS/BLUE SHIELD | Admitting: Internal Medicine

## 2019-09-19 ENCOUNTER — Encounter (INDEPENDENT_AMBULATORY_CARE_PROVIDER_SITE_OTHER): Payer: Self-pay | Admitting: Internal Medicine

## 2019-09-21 ENCOUNTER — Encounter (INDEPENDENT_AMBULATORY_CARE_PROVIDER_SITE_OTHER): Payer: BLUE CROSS/BLUE SHIELD | Admitting: Internal Medicine

## 2020-07-03 ENCOUNTER — Encounter: Payer: Self-pay | Admitting: Internal Medicine

## 2020-11-06 ENCOUNTER — Ambulatory Visit (FREE_STANDING_LABORATORY_FACILITY): Payer: BLUE CROSS/BLUE SHIELD | Admitting: Family Medicine

## 2020-11-06 ENCOUNTER — Encounter (INDEPENDENT_AMBULATORY_CARE_PROVIDER_SITE_OTHER): Payer: Self-pay | Admitting: Family Medicine

## 2020-11-06 VITALS — BP 129/83 | HR 68 | Temp 97.9°F | Ht 70.0 in | Wt 226.6 lb

## 2020-11-06 DIAGNOSIS — Z0000 Encounter for general adult medical examination without abnormal findings: Secondary | ICD-10-CM

## 2020-11-06 LAB — CBC AND DIFFERENTIAL
Absolute NRBC: 0 10*3/uL (ref 0.00–0.00)
Basophils Absolute Automated: 0.04 10*3/uL (ref 0.00–0.08)
Basophils Automated: 0.5 %
Eosinophils Absolute Automated: 0.22 10*3/uL (ref 0.00–0.44)
Eosinophils Automated: 2.6 %
Hematocrit: 48.3 % (ref 37.6–49.6)
Hgb: 16.6 g/dL (ref 12.5–17.1)
Immature Granulocytes Absolute: 0.05 10*3/uL (ref 0.00–0.07)
Immature Granulocytes: 0.6 %
Lymphocytes Absolute Automated: 2.64 10*3/uL (ref 0.42–3.22)
Lymphocytes Automated: 30.8 %
MCH: 31.7 pg (ref 25.1–33.5)
MCHC: 34.4 g/dL (ref 31.5–35.8)
MCV: 92.4 fL (ref 78.0–96.0)
MPV: 10.8 fL (ref 8.9–12.5)
Monocytes Absolute Automated: 0.94 10*3/uL — ABNORMAL HIGH (ref 0.21–0.85)
Monocytes: 11 %
Neutrophils Absolute: 4.67 10*3/uL (ref 1.10–6.33)
Neutrophils: 54.5 %
Nucleated RBC: 0 /100 WBC (ref 0.0–0.0)
Platelets: 291 10*3/uL (ref 142–346)
RBC: 5.23 10*6/uL (ref 4.20–5.90)
RDW: 12 % (ref 11–15)
WBC: 8.56 10*3/uL (ref 3.10–9.50)

## 2020-11-06 LAB — COMPREHENSIVE METABOLIC PANEL
ALT: 24 U/L (ref 0–55)
AST (SGOT): 27 U/L (ref 5–34)
Albumin/Globulin Ratio: 1.6 (ref 0.9–2.2)
Albumin: 4.3 g/dL (ref 3.5–5.0)
Alkaline Phosphatase: 54 U/L (ref 37–117)
Anion Gap: 10 (ref 5.0–15.0)
BUN: 12 mg/dL (ref 9.0–28.0)
Bilirubin, Total: 0.8 mg/dL (ref 0.2–1.2)
CO2: 28 mEq/L (ref 21–29)
Calcium: 10.3 mg/dL (ref 8.5–10.5)
Chloride: 99 mEq/L — ABNORMAL LOW (ref 100–111)
Creatinine: 1 mg/dL (ref 0.5–1.5)
Globulin: 2.7 g/dL (ref 2.0–3.6)
Glucose: 125 mg/dL — ABNORMAL HIGH (ref 70–100)
Potassium: 4.4 mEq/L (ref 3.5–5.1)
Protein, Total: 7 g/dL (ref 6.0–8.3)
Sodium: 137 mEq/L (ref 136–145)

## 2020-11-06 LAB — LIPID PANEL
Cholesterol / HDL Ratio: 5.8 Index
Cholesterol: 213 mg/dL — ABNORMAL HIGH (ref 0–199)
HDL: 37 mg/dL — ABNORMAL LOW (ref 40–9999)
LDL Calculated: 126 mg/dL — ABNORMAL HIGH (ref 0–99)
Triglycerides: 250 mg/dL — ABNORMAL HIGH (ref 34–149)
VLDL Calculated: 50 mg/dL — ABNORMAL HIGH (ref 10–40)

## 2020-11-06 LAB — HEMOGLOBIN A1C
Average Estimated Glucose: 159.9 mg/dL
Hemoglobin A1C: 7.2 % — ABNORMAL HIGH (ref 4.6–5.9)

## 2020-11-06 LAB — HEMOLYSIS INDEX: Hemolysis Index: 8 Index (ref 0–24)

## 2020-11-06 LAB — GFR: EGFR: >60

## 2020-11-06 LAB — MICROALBUMIN, RANDOM URINE
Urine Creatinine, Random: 102.4 mg/dL
Urine Microalbumin, Random: 14 ug/ml (ref 0.0–30.0)
Urine Microalbumin/Creatinine Ratio: 14 ug/mg (ref 0–30)

## 2020-11-06 NOTE — Progress Notes (Signed)
Have you seen any specialists/other providers since your last visit with [place]?    No    Arm preference verified?   Yes, no preference    Health Maintenance Due   Topic Date Due    Colorectal Cancer Screening  Never done    Advance Directive on File  Never done    Annual Exam  Never done    Shingrix Vaccine 50+ (1) Never done    COVID-19 Vaccine (3 - Booster for Pfizer series) 05/30/2020    INFLUENZA VACCINE  10/20/2020

## 2020-11-06 NOTE — Progress Notes (Signed)
[facility]                  Subjective:      Date: 11/06/2020 11:52 AM   Patient ID: Michael Jones is a 50 y.o. male.    Chief Complaint:  Chief Complaint   Patient presents with    Annual Exam     Pt is fasting       HPI  Visit Type: Health Maintenance Visit  Work Status: working full-time  Reported Health: good health  Diet: moderate compliance with recommended diet  Exercise: 3-4x/week  Dental: dentist visit > 1 year ago  Vision: glasses and eye exam > 1 year ago  Hearing: normal hearing  Immunization Status: immunizations up to date  Reproductive Health: sexually active  Prior Screening Tests: last PSA > 1 year ago and no previous colorectal cancer screening  General Health Risks: no family history of prostate cancer, no family history of colon cancer, and no family history of breast cancer  Safety Elements Used: uses seat belts, smoke detectors in household, carbon monoxide detectors in household, and sunscreen use    1. Michael Jones overall feels well and in his usual state of health.    Problem List:  Patient Active Problem List   Diagnosis    Essential hypertension    Ventral hernia       Current Medications:  Outpatient Medications Marked as Taking for the 11/06/20 encounter (Office Visit) with Claudell Kyle, MD   Medication Sig Dispense Refill    [DISCONTINUED] Ascorbic Acid (VITAMIN C) 100 MG tablet Take 100 mg by mouth daily      [DISCONTINUED] Cholecalciferol 25 MCG (1000 UT) capsule Take by mouth      [DISCONTINUED] fluticasone (FLONASE) 50 MCG/ACT nasal spray 2 sprays by Nasal route      [DISCONTINUED] lisinopril (ZESTRIL) 20 MG tablet TAKE 1.5 TABLETS (30 MG TOTAL) BY MOUTH 2 (TWO) TIMES DAILY 270 tablet 0       Allergies:  Allergies   Allergen Reactions    Nabumetone Hives       Past Medical History:  Past Medical History:   Diagnosis Date    Diverticulitis     Hypertension        Past Surgical History:  Past Surgical History:   Procedure Laterality Date     HERNIA REPAIR      KNEE SURGERY Left     partial knee replacement    KNEE SURGERY  02/2017       Family History:  Family History   Problem Relation Age of Onset    Lung cancer Mother 23        +smoking    Cancer Father     Hypertension Brother     Stroke Maternal Uncle     Prostate cancer Neg Hx     Colon cancer Neg Hx     Heart disease Neg Hx        Social History:  Social History     Tobacco Use    Smoking status: Never    Smokeless tobacco: Former     Types: Snuff    Tobacco comments:     Snuff 50 y/o - 50 y/o   Vaping Use    Vaping Use: Never used   Substance Use Topics    Alcohol use: Yes     Comment: 14 wine or beer per week    Drug use: Not Currently  The following sections were reviewed this encounter by the provider:   Tobacco   Allergies   Meds   Problems   Med Hx   Surg Hx   Fam Hx   Soc Hx          Vitals:  BP 129/83    Pulse 68    Temp 97.9 F (36.6 C) (Temporal)    Ht 1.778 m (5\' 10" )    Wt 102.8 kg (226 lb 9.6 oz)    SpO2 98%    BMI 32.51 kg/m    Wt Readings from Last 3 Encounters:   11/06/20 102.8 kg (226 lb 9.6 oz)   01/19/19 93.7 kg (206 lb 9.6 oz)   12/22/18 88.5 kg (195 lb)       ROS:   Review of Systems   General/Constitutional:   Denies Change in appetite. Denies Chills. Denies Fatigue. Denies Fever. Denies Weight gain. Denies Weight loss.   Ophthalmologic:   Denies Blurred vision. Denies Diminished visual acuity. Denies Eye Pain.   ENT:   Denies Hearing Loss. Denies Nasal Discharge. Denies Hoarseness. Denies Ear pain. Denies Nosebleed. Denies Sinus pain. Denies Sore throat. Denies Sneezing.   Endocrine:   Denies Polydipsia. Denies Polyuria.   Cardiovascular:   Denies Chest pain. Denies Chest pain with exertion. Denies Leg Claudication. Denies Palpitations. Denies Swelling in hands/feet.   Respiratory:   Denies Paroxysmal Nocturnal Dyspnea. Denies Cough. Denies Orthopnea. Denies Shortness of breath. Denies Daytime Hypersomnolence. Denies Snoring. Denies Witness Apnea. Denies Wheezing.    Gastrointestinal:   Denies Abdominal pain. Denies Blood in stool. Denies Constipation. Denies Diarrhea. Denies Heartburn. Denies Nausea. Denies Vomiting.   Hematology:   Denies Easy bruising. Denies Easy Bleeding.   Genitourinary:   Denies Blood in urine. Denies Nocturia.   Musculoskeletal:   Denies Joint pain. Denies Leg cramps. Denies Weakness in LE. Denies Swollen joints.   Peripheral Vascular:   Denies Cold extremities. Denies Decreased sensation in extremities. Denies Painful extremities.   Skin:   Denies Itching. Denies Change in Mole(s). Denies Rash.   Neurologic:   Denies Balance difficulty. Denies Dizziness. Denies Headache. Denies Pre-Syncope. Denies Memory loss.   Psychiatric:   Denies Anxiety. Denies Depressed mood. Denies Difficulty sleeping. Denies Mood Swings.     Objective:   Physical Exam   Examination:   General Examination:   GENERAL APPEARANCE: alert, in no acute distress, well developed, well nourished, oriented to time, place, and person.   HEAD: normal appearance, atraumatic.   EYES: extraocular movement intact (EOMI), pupils equal, round, reactive to light and accommodation, sclera anicteric, conjunctiva clear.   EARS: tympanic membranes normal bilaterally, external canals normal .   NOSE: normal nasal mucosa, no lesions.   ORAL CAVITY: normal oropharynx, normal lips, mucosa moist, no lesions.   THROAT: normal appearance, clear, no erythema.   NECK/THYROID: neck supple, no carotid bruit, carotid pulse 2+ bilaterally, no cervical lymphadenopathy, no neck mass palpated, no jugular venous distention, no thyromegaly.   LYMPH NODES: no palpable adenopathy.   SKIN: good turgor, no rashes, no suspicious lesions.   HEART: S1, S2 normal, no murmurs, rubs, gallops, regular rate and rhythm.   LUNGS: normal effort / no distress, normal breath sounds, clear to auscultation bilaterally, no wheezes, rales, rhonchi.   ABDOMEN: small ventral hernia noted. bowel sounds present, no hepatosplenomegaly, soft,  nontender, nondistended.   RECTAL: not examined   MALE GENITOURINARY: not examined  MUSCULOSKELETAL: full range of motion, no swelling or deformity.   EXTREMITIES:  no edema, no clubbing, cyanosis, or edema.   PERIPHERAL PULSES: 2+ dorsalis pedis, 2+ posterior tibial.   NEUROLOGIC: nonfocal, cranial nerves 2-12 grossly intact, deep tendon reflexes 2+ symmetrical, normal strength, tone and reflexes, sensory exam intact.   PSYCH: cognitive function intact, mood/affect full range, speech clear.      Assessment:       1. Annual physical exam - appears in stable health. Weight fluctuations due to inactivity due to orthopedic injuries/ surgeries.   - advised moderation with alcohol use  - CBC and differential  - Comprehensive metabolic panel  - Hemoglobin A1C  - Lipid panel  - Urine Microalbumin Random  - Ambulatory referral to Gastroenterology  - Has dermatology appt tomorrow    Plan:     Health Maintenance:   High BMI follow-up: Encouragement to Exercise. Recommend optimizing low carbohydrate diet efforts and obtaining at least 150 minutes of aerobic exercise per week. Recommend 20-25 grams of dietary fiber daily. Recommend drinking at least 60-80 ounces of water per day.Immunizations UTD. Vision screening is due. Dental Screening is due.    Follow-up:   Return in about 6 months (around 05/09/2021).     Claudell Kyle, MD

## 2020-11-07 ENCOUNTER — Telehealth (INDEPENDENT_AMBULATORY_CARE_PROVIDER_SITE_OTHER): Payer: Self-pay | Admitting: Family Medicine

## 2020-11-07 ENCOUNTER — Encounter (INDEPENDENT_AMBULATORY_CARE_PROVIDER_SITE_OTHER): Payer: BLUE CROSS/BLUE SHIELD | Admitting: Family Medicine

## 2020-11-07 ENCOUNTER — Encounter (INDEPENDENT_AMBULATORY_CARE_PROVIDER_SITE_OTHER): Payer: Self-pay | Admitting: Family Medicine

## 2020-11-07 NOTE — Telephone Encounter (Signed)
Patient is returning a call from the office to go over his labs.     Please call the patient back.     Thank you

## 2020-11-07 NOTE — Telephone Encounter (Signed)
I gave Mr.Zertuche a call and also made him an appointment for 8/24 to see Dr.nguyen via [facility].

## 2020-11-12 ENCOUNTER — Encounter (INDEPENDENT_AMBULATORY_CARE_PROVIDER_SITE_OTHER): Payer: Self-pay | Admitting: Family Medicine

## 2020-11-12 ENCOUNTER — Telehealth (INDEPENDENT_AMBULATORY_CARE_PROVIDER_SITE_OTHER): Payer: BLUE CROSS/BLUE SHIELD | Admitting: Family Medicine

## 2020-11-12 DIAGNOSIS — E782 Mixed hyperlipidemia: Secondary | ICD-10-CM

## 2020-11-12 DIAGNOSIS — E119 Type 2 diabetes mellitus without complications: Secondary | ICD-10-CM

## 2020-11-12 DIAGNOSIS — Z125 Encounter for screening for malignant neoplasm of prostate: Secondary | ICD-10-CM

## 2020-11-12 DIAGNOSIS — I10 Essential (primary) hypertension: Secondary | ICD-10-CM

## 2020-11-12 MED ORDER — LISINOPRIL-HYDROCHLOROTHIAZIDE 20-12.5 MG PO TABS
1.0000 | ORAL_TABLET | Freq: Every day | ORAL | 1 refills | Status: DC
Start: 2020-11-12 — End: 2021-03-18

## 2020-11-12 MED ORDER — METFORMIN HCL 500 MG PO TABS
500.0000 mg | ORAL_TABLET | Freq: Two times a day (BID) | ORAL | 1 refills | Status: DC
Start: 2020-11-12 — End: 2021-03-18

## 2020-11-12 NOTE — Progress Notes (Signed)
Have you seen any specialists/other providers since your last visit with [place]?    Yes    Arm preference verified?   Yes, no preference    Health Maintenance Due   Topic Date Due    Colorectal Cancer Screening  Never done    Advance Directive on File  Never done    Shingrix Vaccine 50+ (1) Never done    COVID-19 Vaccine (4 - Booster) 05/02/2020    INFLUENZA VACCINE  10/20/2020

## 2020-11-12 NOTE — Addendum Note (Signed)
Addended by: Claudell Kyle on: 11/12/2020 09:54 PM     Modules accepted: Orders

## 2020-11-12 NOTE — Progress Notes (Signed)
[facility]                       Date of Exam: 11/12/2020 4:27 PM        Patient ID: Michael Jones is a 50 y.o. male.  Attending Physician: Claudell Kyle, MD        Chief Complaint:    Chief Complaint   Patient presents with    Follow-up        Verbal consent has been obtained from the patient to conduct this video visit encounter to minimize exposure to COVID-19.        HPI:    Michael Jones has newly diagnosed T2 DM (dx 2022, progression from prediabetes), HTN, HLP, BMI = 32. Maternal uncle with stroke, no other FH of ASCVD.  He feels well and in his usual state of health. He has recently restarted exercising.           Problem List:    Patient Active Problem List   Diagnosis    Essential hypertension    Ventral hernia             Current Meds:    Outpatient Medications Marked as Taking for the 11/12/20 encounter (Telemedicine Visit) with Claudell Kyle, MD   Medication Sig Dispense Refill    fluocinonide (LIDEX) 0.05 % cream       lisinopril-hydroCHLOROthiazide (ZESTORETIC) 20-12.5 MG per tablet Take 1 tablet by mouth daily 90 tablet 1    [DISCONTINUED] lisinopril-hydroCHLOROthiazide (ZESTORETIC) 20-12.5 MG per tablet Take 1 tablet by mouth daily            Allergies:    Allergies   Allergen Reactions    Nabumetone Hives             Past Surgical History:    Past Surgical History:   Procedure Laterality Date    HERNIA REPAIR      KNEE SURGERY Left     partial knee replacement    KNEE SURGERY  02/2017           Family History:    Family History   Problem Relation Age of Onset    Lung cancer Mother 69        +smoking    Cancer Father     Hypertension Brother     Stroke Maternal Uncle     Prostate cancer Neg Hx     Colon cancer Neg Hx     Heart disease Neg Hx            Social History:    Social History     Tobacco Use    Smoking status: Never    Smokeless tobacco: Former     Types: Snuff    Tobacco comments:     Snuff 50 y/o - 50 y/o   Vaping Use    Vaping Use: Never used    Substance Use Topics    Alcohol use: Yes     Comment: 14 wine or beer per week    Drug use: Not Currently           The following sections were reviewed this encounter by the provider:   Tobacco   Allergies   Meds   Problems   Med Hx   Surg Hx   Fam Hx                ROS:    Review of Systems   General/Constitutional:  Denies Chills. Denies Fatigue. Denies Fever.   Respiratory:   Denies Cough. Denies Orthopnea. Denies Shortness of breath. Denies Wheezing.   Cardiovascular:   Denies Chest pain. Denies Chest pain with exertion.  Denies Palpitations. Denies Swelling in hands/feet.   Gastrointestinal:   Denies Abdominal pain. Denies Blood in stool. Denies Constipation. Denies Diarrhea.  Denies Heartburn. Denies Nausea. Denies Vomiting.   Neurologic:   Denies Dizziness. Denies Headache.            Physical Exam:    Physical Exam   GENERAL APPEARANCE: alert, in no acute distress, well developed     11/06/20 11:28   Cholesterol 213 (H)   HDL 37 (L)   LDL Calculated 126 (H)   Hemoglobin A1C 7.2 (H)           Assessment/ Plan:    1. Essential hypertension  - Comprehensive metabolic panel; Future  - Hemoglobin A1C; Future  - Lipid panel; Future  - lisinopril-hydroCHLOROthiazide (ZESTORETIC) 20-12.5 MG per tablet; Take 1 tablet by mouth daily  Dispense: 90 tablet; Refill: 1    2. Type 2 diabetes mellitus - there is evidence to suggest that strict/ aggressive diabetes control early on in patients w/o co-morbidities have improved outcomes.   - discussed lifestyle changes  - start: metFORMIN (GLUCOPHAGE) 500 MG tablet; Take 1 tablet (500 mg total) by mouth 2 (two) times daily with meals  Dispense: 180 tablet; Refill: 1  - GI SEs of Metformin reviewed  - can consider coming off of Metformin if A1c improves    3. Mixed hyperlipidemia  - ASCVD 11.5%  - if we are going strictly by guidelines, then we can start statin. However, his diabetes is mild and his lipid panel is borderline elevated. I do think we can observe for the time  being and reassess his risk in 3 months.          Follow-up:    Return in about 3 months (around 02/12/2021).         Claudell Kyle, MD

## 2021-01-02 ENCOUNTER — Ambulatory Visit (INDEPENDENT_AMBULATORY_CARE_PROVIDER_SITE_OTHER): Payer: BLUE CROSS/BLUE SHIELD | Admitting: Gastroenterology

## 2021-02-17 ENCOUNTER — Ambulatory Visit (INDEPENDENT_AMBULATORY_CARE_PROVIDER_SITE_OTHER): Payer: BLUE CROSS/BLUE SHIELD | Admitting: Gastroenterology

## 2021-03-02 ENCOUNTER — Other Ambulatory Visit (FREE_STANDING_LABORATORY_FACILITY): Payer: BLUE CROSS/BLUE SHIELD

## 2021-03-02 ENCOUNTER — Other Ambulatory Visit: Payer: BLUE CROSS/BLUE SHIELD

## 2021-03-02 ENCOUNTER — Other Ambulatory Visit (INDEPENDENT_AMBULATORY_CARE_PROVIDER_SITE_OTHER): Payer: Self-pay | Admitting: Family Medicine

## 2021-03-02 DIAGNOSIS — Z125 Encounter for screening for malignant neoplasm of prostate: Secondary | ICD-10-CM

## 2021-03-02 DIAGNOSIS — I10 Essential (primary) hypertension: Secondary | ICD-10-CM

## 2021-03-02 DIAGNOSIS — Z0189 Encounter for other specified special examinations: Secondary | ICD-10-CM

## 2021-03-02 LAB — COMPREHENSIVE METABOLIC PANEL
ALT: 16 U/L (ref 0–55)
AST (SGOT): 26 U/L (ref 5–41)
Albumin/Globulin Ratio: 1.6 (ref 0.9–2.2)
Albumin: 4.5 g/dL (ref 3.5–5.0)
Alkaline Phosphatase: 57 U/L (ref 37–117)
Anion Gap: 11 (ref 5.0–15.0)
BUN: 15 mg/dL (ref 9.0–28.0)
Bilirubin, Total: 1 mg/dL (ref 0.2–1.2)
CO2: 25 mEq/L (ref 17–29)
Calcium: 10.1 mg/dL (ref 8.5–10.5)
Chloride: 102 mEq/L (ref 99–111)
Creatinine: 1 mg/dL (ref 0.5–1.5)
Globulin: 2.8 g/dL (ref 2.0–3.6)
Glucose: 106 mg/dL — ABNORMAL HIGH (ref 70–100)
Potassium: 3.9 mEq/L (ref 3.5–5.3)
Protein, Total: 7.3 g/dL (ref 6.0–8.3)
Sodium: 138 mEq/L (ref 135–145)

## 2021-03-02 LAB — LIPID PANEL
Cholesterol / HDL Ratio: 4.3 Index
Cholesterol: 188 mg/dL (ref 0–199)
HDL: 44 mg/dL (ref 40–9999)
LDL Calculated: 114 mg/dL — ABNORMAL HIGH (ref 0–99)
Triglycerides: 149 mg/dL (ref 34–149)
VLDL Calculated: 30 mg/dL (ref 10–40)

## 2021-03-02 LAB — TESTOSTERONE: Testosterone: 359 ng/dL (ref 241–827)

## 2021-03-02 LAB — HEMOLYSIS INDEX: Hemolysis Index: 21 Index (ref 0–24)

## 2021-03-02 LAB — PROSTATE SPECIFIC ANTIGEN SCREEN: Prostate Specific Antigen Screen: 1.405 ng/mL (ref 0.000–4.000)

## 2021-03-02 LAB — HEMOGLOBIN A1C
Average Estimated Glucose: 122.6 mg/dL
Hemoglobin A1C: 5.9 % (ref 4.6–5.9)

## 2021-03-02 LAB — GFR: EGFR: >60

## 2021-03-03 ENCOUNTER — Ambulatory Visit (INDEPENDENT_AMBULATORY_CARE_PROVIDER_SITE_OTHER): Payer: BLUE CROSS/BLUE SHIELD | Admitting: Family Medicine

## 2021-03-03 ENCOUNTER — Encounter (INDEPENDENT_AMBULATORY_CARE_PROVIDER_SITE_OTHER): Payer: Self-pay | Admitting: Family Medicine

## 2021-03-03 VITALS — BP 131/81 | HR 64 | Temp 97.5°F | Wt 209.2 lb

## 2021-03-03 DIAGNOSIS — M898X1 Other specified disorders of bone, shoulder: Secondary | ICD-10-CM

## 2021-03-03 DIAGNOSIS — Z7185 Encounter for immunization safety counseling: Secondary | ICD-10-CM

## 2021-03-03 DIAGNOSIS — E119 Type 2 diabetes mellitus without complications: Secondary | ICD-10-CM

## 2021-03-03 DIAGNOSIS — Z6830 Body mass index (BMI) 30.0-30.9, adult: Secondary | ICD-10-CM

## 2021-03-03 NOTE — Progress Notes (Signed)
[facility]                       Date of Exam: 03/03/2021 1:06 PM        Patient ID: Michael Jones is a 50 y.o. male.  Attending Physician: Claudell Kyle, MD        Chief Complaint:    Chief Complaint   Patient presents with    Follow-up             HPI:    HPI    1. Mr. Michael Jones has T2 DM (dx 2022, progression from prediabetes), HTN, HLP, BMI = 30. Maternal uncle with stroke, no other FH of ASCVD.   Since the last visit, 4 months ago, he has lost about 20lbs with exercise and portion control. He is tolerating Metformin w/o GI side effects.  He feels well, healthy, and happy with his changes.  2. He injured his right clavicle while weight lifting at the gym, now with right medial pain, worse if he lifts his right arm.             Problem List:    Patient Active Problem List   Diagnosis    Essential hypertension    Ventral hernia         Allergies:    Allergies   Allergen Reactions    Nabumetone Hives             Current Meds:    Outpatient Medications Marked as Taking for the 03/03/21 encounter (Office Visit) with Claudell Kyle, MD   Medication Sig Dispense Refill    lisinopril-hydroCHLOROthiazide (ZESTORETIC) 20-12.5 MG per tablet Take 1 tablet by mouth daily 90 tablet 1    metFORMIN (GLUCOPHAGE) 500 MG tablet Take 1 tablet (500 mg total) by mouth 2 (two) times daily with meals 180 tablet 1           Vital Signs:    BP 131/81   Pulse 64   Temp 97.5 F (36.4 C) (Temporal)   Wt 94.9 kg (209 lb 3.2 oz)   SpO2 97%   BMI 30.02 kg/m          Physical Exam:    GEN: Well-appearing, no acute distress  HEENT: small lipoma over left SCM, no cervical lymphadenopathy  CV: RRR, no m/r/g  Pul: Normal work of breathing, CTAB  MSK: TTP over sternoclavicular joint     11/06/20 11:28 03/02/21 09:48   Cholesterol 213 (H) 188   HDL 37 (L) 44   LDL Calculated 126 (H) 114 (H)      11/06/20 11:28 03/02/21 09:48   Hemoglobin A1C 7.2 (H) 5.9         Assessment & Plan:    1. Type 2 diabetes  mellitus / BMI 30.0-30.9,adult - doing well with lifestyle changes and metformin.  - continue present medications, tolerating well  - continue with weight loss strategy: portion control and exercise  - repeat labs in 6 months    2. Pain of right clavicle  - suspect due to sternoclavicular joint pain   - advised standing NSAIDs for a few days, along with massage, warm compresses    3. Vaccine counseling  - to consider the Shingrix and pneumonia vaccine.            Follow-up:    Return in about 6 months (around 09/01/2021).         Claudell Kyle, MD

## 2021-03-03 NOTE — Progress Notes (Signed)
Have you seen any specialists/other providers since your last visit with [place]?    Yes    Arm preference verified?   Yes, no preference    Health Maintenance Due   Topic Date Due    Statin Use  Never done    Colorectal Cancer Screening  Never done    Advance Directive on File  Never done    DM OPHTHALMOLOGY EXAM  Never done    Shingrix Vaccine 50+ (1) Never done    HIGH RISK PNEUMONIA  Never done    COVID-19 Vaccine (4 - Booster) 02/25/2020    INFLUENZA VACCINE  10/20/2020

## 2021-03-10 ENCOUNTER — Encounter (INDEPENDENT_AMBULATORY_CARE_PROVIDER_SITE_OTHER): Payer: Self-pay | Admitting: Family Medicine

## 2021-03-18 ENCOUNTER — Other Ambulatory Visit (INDEPENDENT_AMBULATORY_CARE_PROVIDER_SITE_OTHER): Payer: Self-pay | Admitting: Family Medicine

## 2021-03-18 DIAGNOSIS — E119 Type 2 diabetes mellitus without complications: Secondary | ICD-10-CM

## 2021-03-18 DIAGNOSIS — I10 Essential (primary) hypertension: Secondary | ICD-10-CM

## 2021-03-18 MED ORDER — LISINOPRIL-HYDROCHLOROTHIAZIDE 20-12.5 MG PO TABS
1.0000 | ORAL_TABLET | Freq: Every day | ORAL | 1 refills | Status: DC
Start: 2021-03-18 — End: 2021-05-18

## 2021-03-18 MED ORDER — METFORMIN HCL 500 MG PO TABS
500.0000 mg | ORAL_TABLET | Freq: Two times a day (BID) | ORAL | 1 refills | Status: DC
Start: 2021-03-18 — End: 2022-10-05

## 2021-03-27 ENCOUNTER — Ambulatory Visit (INDEPENDENT_AMBULATORY_CARE_PROVIDER_SITE_OTHER): Payer: BLUE CROSS/BLUE SHIELD | Admitting: Gastroenterology

## 2021-03-27 ENCOUNTER — Encounter (INDEPENDENT_AMBULATORY_CARE_PROVIDER_SITE_OTHER): Payer: Self-pay | Admitting: Gastroenterology

## 2021-03-27 VITALS — BP 155/90 | HR 87 | Temp 97.9°F | Ht 70.0 in | Wt 214.0 lb

## 2021-03-27 DIAGNOSIS — E119 Type 2 diabetes mellitus without complications: Secondary | ICD-10-CM

## 2021-03-27 DIAGNOSIS — Z8719 Personal history of other diseases of the digestive system: Secondary | ICD-10-CM

## 2021-03-27 DIAGNOSIS — K5790 Diverticulosis of intestine, part unspecified, without perforation or abscess without bleeding: Secondary | ICD-10-CM

## 2021-03-27 DIAGNOSIS — Z01818 Encounter for other preprocedural examination: Secondary | ICD-10-CM

## 2021-03-27 MED ORDER — NA SULFATE-K SULFATE-MG SULF 17.5-3.13-1.6 GM/177ML PO SOLN
ORAL | 0 refills | Status: DC
Start: 2021-03-27 — End: 2022-10-05

## 2021-03-27 NOTE — Progress Notes (Signed)
[address]  [place], [place] [address]  Appointments: Phone [phone], Fax [phone]  GASTROENTEROLOGY & HEPATOLOGY OUTPATIENT VISIT       Reason for Visit:   Screening for colorectal cancer    History of Present Illness:  Michael Jones is a 51 y.o. male who is referred by Claudell Kyle, MD for consultation of the above reason(s)    Heartburn/reflux no  Dysphagia  no  Odynophagia  no   Chest pain no   Regurgitation no     Nausea no   Vomiting no   Abdominal pain no   Diarrhea no   Constipation no    Black stools   no  Red bloody stools no  Blood on toilet paperno   Blood in toilet bowl no     Prior colon no   No fam hx of crc  No fam hx of colon polyps    Works in [employer] in [place]     Past Medical History  Past Medical History:   Diagnosis Date    Abdominal hernia 1981    Diverticulitis     Diverticulosis of intestine 2016    Hypertension        Past Surgical History  Past Surgical History:   Procedure Laterality Date    HERNIA REPAIR      KNEE SURGERY Left     partial knee replacement    KNEE SURGERY  02/2017       Allergies  Allergies   Allergen Reactions    Nabumetone Hives       Prior to Admission medications    Medication Sig Start Date End Date Taking? Authorizing Provider   lisinopril-hydroCHLOROthiazide (ZESTORETIC) 20-12.5 MG per tablet Take 1 tablet by mouth daily 03/18/21  Yes Claudell Kyle, MD   metFORMIN (GLUCOPHAGE) 500 MG tablet Take 1 tablet (500 mg) by mouth 2 (two) times daily with meals 03/18/21  Yes Claudell Kyle, MD       Family History  Family History   Problem Relation Age of Onset    Lung cancer Mother 33        +smoking     Cancer Father     Hypertension Brother     Stroke Maternal Uncle     Prostate cancer Neg Hx     Colon cancer Neg Hx     Heart disease Neg Hx        Social History  Social History     Tobacco Use    Smoking status: Never    Smokeless tobacco: Former     Types: Snuff     Quit date: 12/21/1998    Tobacco comments:     Snuff 51 y/o - 51 y/o   Vaping Use    Vaping Use: Never used   Substance Use Topics    Alcohol use: Yes     Alcohol/week: 6.0 standard drinks     Types: 6 Cans of beer per week     Comment: 14 wine or beer per week    Drug use: Not Currently       Review of Systems:  Review of Systems   Constitutional: Denies weight loss.  Eyes: Denies yellow eyes.   Respiratory: Denies cough or shortness of breath  Cardiovascular: Denies chest pain .  Gastrointestinal: See HPI.  Neurologic: Denies confusion  Psych: Denies anxiety  Musculoskeletal: Denies back pain   Skin: Denies jaundice    All other review of systems reviewed and negative     Vital Signs:     Current Outpatient Medications   Medication Sig Dispense Refill  lisinopril-hydroCHLOROthiazide (ZESTORETIC) 20-12.5 MG per tablet Take 1 tablet by mouth daily 90 tablet 1    metFORMIN (GLUCOPHAGE) 500 MG tablet Take 1 tablet (500 mg) by mouth 2 (two) times daily with meals 180 tablet 1    Na sulfate, K sulfate, Mg sulfate (Suprep Bowel Prep Kit) oral solution Take one kit (two bottles in one package) following the instructions sent from your GI doctor's office. 354 mL 0     No current facility-administered medications for this visit.        Objective:   BP 155/90   Pulse 87   Temp 97.9 F (36.6 C)   Ht 1.778 m (5\' 10" )   Wt 97.1 kg (214 lb)   SpO2 99%   BMI 30.71 kg/m   Physical Exam   General Appearance:    Alert, no distress, appears stated age   Head:    Normocephalic    Eyes:    anicteric   Nose:   no drainage   Lungs:     respirations unlabored, good respiratory effort    Heart:    Regular rate   Abdomen:     non-distended, Rectal exam deferred    Extremities:   no edema   Skin:   No jaundice   Neurologic:   Alert and oriented, nonfocal     Laboratory and Data Review:    Recent Labs     11/06/20  1128 12/03/18  2306 12/03/18  2041   WBC 8.56 15.22* 17.82*   Hgb 16.6 15.0 15.3   Hematocrit 48.3 42.9 44.6   Platelets 291 267 262       Recent Labs     12/03/18  2306 12/03/18  2041   PT 12.8 13.5   PT INR 1.0 1.0       Recent Labs     03/02/21  0948 11/06/20  1128 12/03/18  2306 12/03/18  2041   Sodium 138 137 135* 136   Potassium 3.9 4.4 4.5 4.6   Chloride 102 99* 102 101   CO2 25 28 20* 19*   BUN 15.0 12.0 16.0 16   Creatinine 1.0 1.0 1.2 1.2   Calcium 10.1 10.3 9.0 9.1   Albumin 4.5 4.3  --  4.3   Protein, Total 7.3 7.0  --  6.7   Bilirubin, Total 1.0 0.8  --  0.4   Alkaline Phosphatase 57 54  --  59   ALT 16 24  --  20   AST (SGOT) 26 27  --  91*   Glucose 106* 125* 93 95          Rads:   No results found.      Assessment/Recommendations:     1. Preop examination    2. History of diverticulitis    3. Type 2 diabetes mellitus without complication, without long-term current use of insulin    4. Diverticulosis             Screening for colorectal cancer can identify premalignant/precancerous lesions and detect asymptomatic early-stage malignancy/cancer. Screening has been shown to decrease mortality from colorectal cancer. Guidelines have recommended that screening should start at age 101 for average risk individuals     Screening options include fiberoptic colonoscopy, CT colonoscopy (every 5 years), flexible sigmoidoscopy (every 5 years) or stool testing (iFOBT or FIT annually on one sample, annual FOBT on 3 samples or multitarget stool DNA testing called Cologuard on one sample every 3 years).     Colonoscopy  is considered the optimal screening test in the majority of individuals. It visualizes the colon with a lighted camera that visualizes lesions as it passes through the intestinal tract which allows for biopsy and lesion removal at the time of the  test.    Discussed screening options. The patient has elected to proceed with a colonoscopy. Risks and benefits were reviewed. All questions were answered.    The colonoscopy procedure was explained to the patient, including rationale of removing polyps before they become cancerous.   Risks/benefits/alternatives were discussed including but not limiting:bleeding, infection, perforation, injury to spleen/liver, missed lesions, and anesthesia complications (such as heart/lung issues). Prep instructions and encourage to drink entire prescribed regimen also discussed. The patient voiced understanding and agrees to proceed.      Recommendations:  -Colonoscopy +/- biopsy +/- polypectomy   -low fiber diet beginning 3 days prior   -avoid NSAIDs beginning 5 days prior   -if baseline constipation, begin miralax 1 dose/packet/cap daily and take until 1 day before beginning bowel preparation        Summary:       1. Preop examination  - Na sulfate, K sulfate, Mg sulfate (Suprep Bowel Prep Kit) oral solution; Take one kit (two bottles in one package) following the instructions sent from your GI doctor's office.  Dispense: 354 mL; Refill: 0    2. History of diverticulitis    3. Type 2 diabetes mellitus without complication, without long-term current use of insulin  - Na sulfate, K sulfate, Mg sulfate (Suprep Bowel Prep Kit) oral solution; Take one kit (two bottles in one package) following the instructions sent from your GI doctor's office.  Dispense: 354 mL; Refill: 0    4. Diverticulosis     There are no discontinued medications.       Thank you for allowing me to participate in the care of Kosisochukwu Kubin.   All recent labs and radiologic studies were reviewed at the time of the visit.   If you have any questions or concerns, please do not hesitate to contact us.    Follow-up date to be determined as needed after the diagnostic test/procedure results are back.    Continue follow up with primary physician but return earlier if  symptoms worsen or fail to improve.    Lanae Boast, DO  [facility]   [address]  [place], [place] [address]  Phone [phone]          This note was generated by the Epic EMR system/ Dragon speech recognition and may contain inherent errors or omissions not intended by the user. Grammatical errors, random word insertions, deletions and pronoun errors are occasional consequences of this technology due to software limitations. Not all errors are caught or corrected. If there are questions or concerns about the content of this note or information contained within the body of this dictation they should be addressed directly with the author for clarification.

## 2021-03-27 NOTE — Patient Instructions (Addendum)
suprep    Screening for colorectal cancer can identify premalignant/precancerous lesions and detect asymptomatic early-stage malignancy/cancer. Screening has been shown to decrease mortality from colorectal cancer. Guidelines have recommended that screening should start at age 52 for average risk individuals     Screening options include fiberoptic colonoscopy, CT colonoscopy (every 5 years), flexible sigmoidoscopy (every 5 years) or stool testing (iFOBT or FIT annually on one sample, annual FOBT on 3 samples or multitarget stool DNA testing called Cologuard on one sample every 3 years).     Colonoscopy is considered the optimal screening test in the majority of individuals. It visualizes the colon with a lighted camera that visualizes lesions as it passes through the intestinal tract which allows for biopsy and lesion removal at the time of the test.    Risks/benefits/alternatives were discussed including but not limiting:bleeding, infection, perforation, injury to spleen/liver, missed lesions, and anesthesia complications (such as heart/lung issues).    Recommendations:  -Colonoscopy +/- biopsy +/- polypectomy   -low fiber diet beginning 3 days prior   -avoid NSAIDs beginning 5 days prior   -if baseline constipation, begin miralax 1 dose/packet/cap daily and take until 1 day before beginning bowel preparation       Colonoscopy Preparation Instructions - SUPREP + 7 DAYS MIRALAX    IF YOU ARE ON BLOOD THINNERS (COUMADIN, PLAVIX, etc.), INSULIN OR OTHER DIABETIC MEDICATIONS, PLEASE LET us KNOW AND CHECK WITH YOUR PRESCRIBING PHYSICIAN FOR INSTRUCTIONS. Your prescribing provider needs to determine if you should stop or stay on your blood thinner before procedure.  Not following these instructions may result in cancellation.      General Endoscopy Information  Do no chew gum or suck on hard candy the day of your procedure.  You must have a responsible adult to accompany you home after the procedure. This person must  pick you up in the endoscopy unit. If you do not have a responsible adult to accompany you home, your procedure will be cancelled.   You may not operate a motor vehicle for the remainder of the day following your procedure.   You may not take a taxi, [place] Goad or bus home unless accompanied by a responsible adult.   If your insurance company requires a REFERRAL, YOU MUST BRING IT WITH YOU. Also, please bring your current insurance card(s), copay (if applicable), and a current picture ID with you on the day of your procedure.   Our office will contact your insurance carrier to verify coverage and, if required, obtain preauthorization for your procedure. However, preauthorization is not a guarantee of payment and you will be responsible for any deductibles, copays, co-insurance, and/or any other plan specific out-of-pocket expenses.   Dependent upon your family history, personal history, prior gastroenterology diagnoses, or findings discovered during your colonoscopy, your procedure may be considered preventative or diagnostic. This determination will not be made until after the procedure has concluded and will be based upon the findings of your exam. In our experience, many insurance carriers cover preventative and diagnostic colonoscopies differently, and as a result, your out-of-pocket payment may also differ. If you have any questions about your coverage, please contact your insurance carrier directly.     If you have any questions, please contact your GI physician's office during normal business hours.    SUPREP PREP KIT  SUPREP Bowel Prep Kit Is taken as a split dose (2-day) regimen. You take the first 6-ounce ounces bottle of SUPREP the evening before your colonoscopy at 5:00 PM. You  will take the second 6-ounce bottle of SUPREP the morning of your colonoscopy (5 hours before your procedure). It is important to drink the additional water as recommended in the instructions for use. Both 6-ounce bottles are required  for a complete prep.     Nine (9) days before your procedure date:  Daily MiraLAX - you will need to purchase a bottle of MiraLAX from any pharmacy. This is over the counter and does not require a prescription.  Please take one scoop (mixed in an 8-ounce glass of water) once daily for 7 days prior to your 2-day prep.   Avoid any salads or high fiber foods including wheat bread, nuts, seeds, popcorn, and fiber supplements.  Fill your bowel medicine prescriptions for your bowel prep.      The day before the procedure:  Drink only clear liquids the entire day. No solid food should be taken. Clear liquids include water, broth without any solid pieces, apple juice, white grape juice, pulp free lemonade, sprite, ginger-ale, coffee or tea without milk or non-dairy creamers, plain Jell-O (no added fruit or toppings, no red, purple, or blue Jell-O. See clear liquid diet below).     DO NOT drink milk  DO NOT eat or drink anything colored red or purple  DO NOT drink alcoholic beverages     Complete steps 1 through 4 using (1) 6-ounce bottle starting at 5:00 p.m.:     Step 1:  Pour ONE (1) 6-ounce bottle of SUPREP liquid into the mixing container.    Step 2:  Add cool drinking water to the 16-ounces line on the container and mix.     Step 3:  Drink ALL the liquid in the container.     Step 4:  You must drink two (2) more 16-ounces containers of water over the next 1 hour.    No solid food should be taken during or after the prep.     The day of your procedure:  5 hours before your procedure time: Start 2nd dose of SUPREP and complete steps 1-4 using the other 6-ounce bottle:    Step 1:  Pour ONE (1) 6-ounce bottle of SUPREP liquid into the mixing container.     Step 2:  Add cool drinking water to the 16-ounce line on the container and mix.    Step 3:  Drink ALL the liquid in the container.    Step 4:  You must drink two (2) more 16-ounce containers of water over the next 1 hour.   NOTE: you must finish drinking the final glass  of water at least 4 hours, or as directed, before your procedure.    It is important that you finish the remaining dose at least 4 hours before your scheduled procedure.   Do not take anything by mouth starting 4 hours before your procedure.  Do not eat hard candy or chew gum.   Wear comfortable clothing that is easy to remove and leave jewelry at home. Leave any valuables (i.e., purse, cell phone etc.) with family or companion.   Report to the Endoscopy Procedure Unit 90 minutes before your scheduled procedure time.   Once in the pre-procedural area, you will be asked to put on a hospital gown. A nurse will review your medical history with you (Bring a list of your current medication, allergies, and a copy of your recent EKG). An intravenous line (IV) will be started for your sedation during the procedure.   When your procedure is  done, you may remain in the recovery room for up to 1 hour.   Your doctor will discuss the results of your procedure with you and give you a copy of your report.     * It is not uncommon for individuals to experience bloating or nausea when drinking a solution. If have vomiting or other concerning symptoms, please call your GI physician's office.    PLEASE NOTE: It is extremely important to follow the preparation listed above, so that the doctor will be able to see your entire colon. Your colon must be clear of any stool. Inadequate preparation limits the value of this procedure and could necessitate rescheduling the examination.     * If you need to reschedule or cancel your appointment, please call your GI physician's office.       FREQUENTLY ASKED QUESTIONS:    My procedure is in the afternoon. Can I eat in the morning?   A: No. To ensure your safety during the procedure, it is important that the stomach is empty. Any food or liquid in the stomach at the time of the procedure places you at risk of aspirating these contents into the lung leading to a serious complication called  aspiration pneumonia. You can drink water until 4 hours before your procedure time.     I ate breakfast (lunch or dinner) the day before my colonoscopy. Is that okay?    A: If the preparation instructions were not followed properly, residual stool may remain in the colon and hide important findings from the examining physician. In some cases, if the colon preparation is not good, you may have to repeat the preparation and the exam. If you accidentally eat any solid food the day before your exam, please call and ask to speak with a member of the nursing staff. You may be asked to reschedule your procedure.     I don't have a ride. Is that okay?   A: No. If you do not have a responsible adult to accompany you home, YOUR PROCEDURE WILL BE CANCELLED.    How many days prior to the procedure should I discontinue my Coumadin, Plavix or other blood thinning medications?   A: If you are taking any blood thinning medications please let your endoscopist know. Generally speaking, you should quit taking your blood thinning medication 2-7 days prior to some procedures depending on the medication; however, you must check with your endoscopist and your prescribing physician to ensure that it is needed and safe for you to do so.     What medications can I take the day before and the day of my procedure?  A: The day prior to your procedure take your medications the way you normally would. However, for those patients taking any type of bowel cleansing preparation, be advised that you may undergo a prolonged period of diarrhea that may flush oral medications out of your system before they have time to take effect. The morning of your procedure you should take any blood pressure or heart medications you may be on with a small sip of water. You can hold most other medications and take them once your procedure has been completed. If you have questions about specific medication(s), please call a member of the endoscopy unit clinical staff.      I am diabetic. Do I take my insulin?   A: You must direct the question to the physician who placed you on this medication. Please check your blood sugar the morning  of your procedure as you normally would. If you have any questions about your diabetes management in conjunction with your fast for your endoscopic procedure, please consult your primary physician.     I am on pain medication? Can I take it prior to my procedure?   A: You may take your prescription pain medications prior to your procedure with a small sip of water. Please inform the pre-procedural clinical staff of any medications you've taken the day of your procedure. If you have any questions, please call a member of the endoscopy unit clinical staff.     I'm having a menstrual period. Should I reschedule my colonoscopy appointment?   A: No. Your menstrual period will not interfere with your physician's ability to complete your procedure.     May I continue taking my iron tablets?   A: No. Iron may cause the formation of dark color stools which can make it difficult for the physician to complete your colonoscopy if your preparation is less than optimal. We recommend you stop taking your oral iron supplements at least one week prior to your procedure.     I have been on Aspirin therapy for my heart. Should I continue to take it?   A: Aspirin may affect blood coagulation. Please check with your endoscopist and prescribing physician.     I am having a colonoscopy tomorrow. I started my colon preparation on time but now I am experiencing diarrhea and/or a bloating feeling. What should I do?   A: Nausea, vomiting and a sense of fullness or bloating can occur any time after beginning your colon preparation. However, it is important that you drink all the preparation. For most people, taking an hour break from the preparation will usually help. Then continue taking the preparation as ordered. If the vomiting returns or symptoms get worse, please call  your GI physician's office.     If you have any questions or concerns, please don't hesitate to call.     Sincerely,    [facility] Gastroenterology Team

## 2021-03-30 ENCOUNTER — Encounter (INDEPENDENT_AMBULATORY_CARE_PROVIDER_SITE_OTHER): Payer: Self-pay

## 2021-05-18 ENCOUNTER — Other Ambulatory Visit (INDEPENDENT_AMBULATORY_CARE_PROVIDER_SITE_OTHER): Payer: Self-pay

## 2021-05-18 DIAGNOSIS — I10 Essential (primary) hypertension: Secondary | ICD-10-CM

## 2021-05-18 MED ORDER — LISINOPRIL-HYDROCHLOROTHIAZIDE 20-12.5 MG PO TABS
1.0000 | ORAL_TABLET | Freq: Two times a day (BID) | ORAL | 1 refills | Status: DC
Start: 2021-05-18 — End: 2022-10-05

## 2021-05-18 NOTE — Telephone Encounter (Addendum)
Received call from pt requested to have lisinopril-HTZ frequency changed as cardiologist Dr Jacinto Reap rx'ed was BID. Med pended to pts preferred pharmacy and also requested to have letter for emotional support and would like to fly from [place] to [facility] with his dog. Pt is now in [place] (VV not available). Pt requested for call back if needs appt - to please leave a detailed voice message if no answer, not just a call back number. Please advise.

## 2021-07-29 ENCOUNTER — Encounter (INDEPENDENT_AMBULATORY_CARE_PROVIDER_SITE_OTHER): Payer: Self-pay | Admitting: Gastroenterology

## 2021-08-05 ENCOUNTER — Ambulatory Visit: Payer: Self-pay

## 2021-08-05 ENCOUNTER — Encounter (FREE_STANDING_LABORATORY_FACILITY): Payer: BLUE CROSS/BLUE SHIELD

## 2021-08-05 ENCOUNTER — Ambulatory Visit (AMBULATORY_SURGERY_CENTER): Payer: BLUE CROSS/BLUE SHIELD | Admitting: Gastroenterology

## 2021-08-05 ENCOUNTER — Encounter (INDEPENDENT_AMBULATORY_CARE_PROVIDER_SITE_OTHER): Payer: Self-pay | Admitting: Gastroenterology

## 2021-08-05 DIAGNOSIS — D120 Benign neoplasm of cecum: Secondary | ICD-10-CM

## 2021-08-05 DIAGNOSIS — D123 Benign neoplasm of transverse colon: Secondary | ICD-10-CM

## 2021-08-05 DIAGNOSIS — D125 Benign neoplasm of sigmoid colon: Secondary | ICD-10-CM

## 2021-08-05 DIAGNOSIS — K648 Other hemorrhoids: Secondary | ICD-10-CM

## 2021-08-05 DIAGNOSIS — Z1211 Encounter for screening for malignant neoplasm of colon: Secondary | ICD-10-CM

## 2021-08-05 DIAGNOSIS — K5790 Diverticulosis of intestine, part unspecified, without perforation or abscess without bleeding: Secondary | ICD-10-CM

## 2021-08-05 DIAGNOSIS — K635 Polyp of colon: Secondary | ICD-10-CM

## 2021-08-05 DIAGNOSIS — K5730 Diverticulosis of large intestine without perforation or abscess without bleeding: Secondary | ICD-10-CM

## 2021-08-05 DIAGNOSIS — K621 Rectal polyp: Secondary | ICD-10-CM

## 2021-08-05 LAB — SURGICAL PATHOLOGY EXAM

## 2021-08-05 NOTE — Progress Notes (Signed)
H&P reviewed - no sig changes  Pt reexamined - no sig changes   No questions about risks that were discussed in office  Agrees to proceed with planned procedures

## 2021-08-07 ENCOUNTER — Encounter (INDEPENDENT_AMBULATORY_CARE_PROVIDER_SITE_OTHER): Payer: Self-pay | Admitting: Gastroenterology

## 2021-08-11 LAB — LAB USE ONLY - HISTORICAL SURGICAL PATHOLOGY

## 2021-09-24 ENCOUNTER — Encounter (INDEPENDENT_AMBULATORY_CARE_PROVIDER_SITE_OTHER): Payer: Self-pay

## 2021-12-27 ENCOUNTER — Encounter (INDEPENDENT_AMBULATORY_CARE_PROVIDER_SITE_OTHER): Payer: Self-pay

## 2022-02-26 ENCOUNTER — Encounter (INDEPENDENT_AMBULATORY_CARE_PROVIDER_SITE_OTHER): Payer: Self-pay

## 2022-03-29 ENCOUNTER — Encounter (INDEPENDENT_AMBULATORY_CARE_PROVIDER_SITE_OTHER): Payer: Self-pay

## 2022-04-29 ENCOUNTER — Encounter (INDEPENDENT_AMBULATORY_CARE_PROVIDER_SITE_OTHER): Payer: Self-pay

## 2022-06-28 ENCOUNTER — Encounter (INDEPENDENT_AMBULATORY_CARE_PROVIDER_SITE_OTHER): Payer: Self-pay

## 2022-10-05 ENCOUNTER — Encounter (INDEPENDENT_AMBULATORY_CARE_PROVIDER_SITE_OTHER): Payer: Self-pay | Admitting: Student in an Organized Health Care Education/Training Program

## 2022-10-05 ENCOUNTER — Ambulatory Visit (INDEPENDENT_AMBULATORY_CARE_PROVIDER_SITE_OTHER): Payer: BLUE CROSS/BLUE SHIELD | Admitting: Student in an Organized Health Care Education/Training Program

## 2022-10-05 VITALS — BP 132/77 | HR 75 | Temp 97.9°F | Ht 70.0 in | Wt 227.2 lb

## 2022-10-05 DIAGNOSIS — E119 Type 2 diabetes mellitus without complications: Secondary | ICD-10-CM

## 2022-10-05 DIAGNOSIS — I10 Essential (primary) hypertension: Secondary | ICD-10-CM

## 2022-10-05 LAB — URINE MICROALBUMIN, RANDOM
Urine Creatinine: 85.9 mg/dL
Urine Microalbumin/Creatinine Ratio: 15 ug/mg (ref <30)
Urine Microalbumin: 12.5 ug/ml (ref 0.0–30.0)

## 2022-10-05 LAB — COMPREHENSIVE METABOLIC PANEL
ALT: 14 U/L (ref 0–55)
AST (SGOT): 18 U/L (ref 5–41)
Albumin/Globulin Ratio: 1.4 (ref 0.9–2.2)
Albumin: 4.2 g/dL (ref 3.5–5.0)
Alkaline Phosphatase: 81 U/L (ref 37–117)
Anion Gap: 9 (ref 5.0–15.0)
BUN: 14 mg/dL (ref 9–28)
Bilirubin, Total: 0.4 mg/dL (ref 0.2–1.2)
CO2: 23 mEq/L (ref 17–29)
Calcium: 10.1 mg/dL (ref 8.5–10.5)
Chloride: 105 mEq/L (ref 99–111)
Creatinine: 1 mg/dL (ref 0.5–1.5)
GFR: >60 mL/min/{1.73_m2} (ref >=60.0)
Globulin: 2.9 g/dL (ref 2.0–3.6)
Glucose: 155 mg/dL — ABNORMAL HIGH (ref 70–100)
Hemolysis Index: 6 Index
Potassium: 4.2 mEq/L (ref 3.5–5.3)
Protein, Total: 7.1 g/dL (ref 6.0–8.3)
Sodium: 137 mEq/L (ref 135–145)

## 2022-10-05 LAB — TSH: TSH: 1.14 u[IU]/mL (ref 0.35–4.94)

## 2022-10-05 LAB — HEMOGLOBIN A1C
Average Estimated Glucose: 122.6 mg/dL
Hemoglobin A1C: 5.9 % — ABNORMAL HIGH (ref 4.6–5.6)

## 2022-10-05 LAB — T4, FREE: T4 Free: 0.82 ng/dL (ref 0.69–1.48)

## 2022-10-05 MED ORDER — METFORMIN HCL 500 MG PO TABS: 1000.0000 mg | ORAL_TABLET | Freq: Two times a day (BID) | ORAL | Status: DC

## 2022-10-05 NOTE — Progress Notes (Signed)
[facility]   OFFICE VISIT            HPI   Chief Complaint   Patient presents with    Medication Problem      HPI    Living in [place] leaving Thursday night.     Metformin 1000mg  BID. A1C 02/2021. BS 8-100. Diabetic eye exam due. Tries to eat more chicken + vegetable. Has not been exercising.     Was on lisinopril-HCTZ was stopped in Jan after having gout (ate a lot of shellfish). He is on losartan 100mg  and amlodipine 10mg . Has BP cuff at home has not been checking everyday.      ROS   Review of Systems   Constitutional:  Negative for activity change, appetite change, chills and fever.   Respiratory:  Negative for cough, chest tightness and shortness of breath.    Cardiovascular:  Negative for chest pain.   Gastrointestinal:  Negative for abdominal pain.   Genitourinary:  Negative for flank pain.   Neurological:  Negative for headaches.         Vital Signs   BP 132/77   Pulse 75   Temp 97.9 F (36.6 C) (Temporal)   Ht 1.778 m (5\' 10" )   Wt 103.1 kg (227 lb 3.2 oz)   SpO2 96%   BMI 32.60 kg/m      Physical Exam   Physical Exam  Vitals reviewed.   Constitutional:       General: He is not in acute distress.     Appearance: He is normal weight. He is not ill-appearing.   HENT:      Head: Normocephalic.   Eyes:      Pupils: Pupils are equal, round, and reactive to light.   Cardiovascular:      Rate and Rhythm: Normal rate and regular rhythm.      Heart sounds: No murmur heard.  Pulmonary:      Effort: Pulmonary effort is normal. No respiratory distress.      Breath sounds: No wheezing.   Neurological:      General: No focal deficit present.      Mental Status: He is alert and oriented to person, place, and time.   Psychiatric:         Mood and Affect: Mood normal.           Assessment/Plan   1. Type 2 diabetes mellitus without complication, without long-term current use of insulin  Metformin 1000mg  BID. A1C 02/2021. BS 8-100. Diabetic eye exam due. Tries to eat more chicken + vegetable. Has not been  exercising.     Plan:   - lipid pnl check next time fasting  - continue metformin 1000mg  BID  - Hemoglobin A1C; Future  - Comprehensive Metabolic Panel; Future  - Urine Microalbumin, Random; Future  - Hemoglobin A1C  - Comprehensive Metabolic Panel  - Urine Microalbumin, Random    2. Essential hypertension  Was on lisinopril-HCTZ was stopped in Jan after having gout (ate a lot of shellfish). He is on losartan 100mg  and amlodipine 10mg . Has BP cuff at home has not been checking everyday.     Plan:   - continue losartan 100mg  and amlodipine 10mg   - Mediterranean diet   - weight loss counseling  - goal <130/80  - If BP high oversease consider switching amlodipine to nifedipine 90mg   - TSH; Future  - T4, Free; Future  - TSH  - T4, Free    Nha-Han Oney Folz, DO

## 2022-10-05 NOTE — Progress Notes (Signed)
Have you seen any specialists/other providers since your last visit with [place]?    No    Arm preference verified?   Yes, no preference]    Health Maintenance Due   Topic Date Due    Colorectal Cancer Screening  Never done    Advance Directive on File  Never done    Shingrix Vaccine 50+ (1) Never done    HEPATITIS C SCREENING  Never done    HIGH RISK PNEUMONIA  Never done    OPHTHALMOLOGY EXAM  10/20/2021    URINE MICROALBUMIN  11/06/2021    Annual Exam  11/06/2021    COVID-19 Vaccine (6 - 2023-24 season) 11/20/2021    HEMOGLOBIN A1C ANNUAL  03/02/2022

## 2022-10-05 NOTE — Patient Instructions (Signed)
If BP high oversease consider switching amlodipine to nifedipine

## 2022-10-06 ENCOUNTER — Encounter (INDEPENDENT_AMBULATORY_CARE_PROVIDER_SITE_OTHER): Payer: Self-pay | Admitting: Student in an Organized Health Care Education/Training Program

## 2022-10-28 ENCOUNTER — Encounter (INDEPENDENT_AMBULATORY_CARE_PROVIDER_SITE_OTHER): Payer: Self-pay

## 2022-11-28 ENCOUNTER — Encounter (INDEPENDENT_AMBULATORY_CARE_PROVIDER_SITE_OTHER): Payer: Self-pay

## 2022-12-28 ENCOUNTER — Encounter (INDEPENDENT_AMBULATORY_CARE_PROVIDER_SITE_OTHER): Payer: Self-pay

## 2023-02-27 ENCOUNTER — Encounter (INDEPENDENT_AMBULATORY_CARE_PROVIDER_SITE_OTHER): Payer: Self-pay

## 2023-03-30 ENCOUNTER — Encounter (INDEPENDENT_AMBULATORY_CARE_PROVIDER_SITE_OTHER): Payer: Self-pay

## 2023-04-30 ENCOUNTER — Encounter (INDEPENDENT_AMBULATORY_CARE_PROVIDER_SITE_OTHER): Payer: Self-pay

## 2023-05-28 ENCOUNTER — Encounter (INDEPENDENT_AMBULATORY_CARE_PROVIDER_SITE_OTHER): Payer: Self-pay

## 2023-08-28 ENCOUNTER — Encounter (INDEPENDENT_AMBULATORY_CARE_PROVIDER_SITE_OTHER): Payer: Self-pay

## 2023-12-28 ENCOUNTER — Encounter (INDEPENDENT_AMBULATORY_CARE_PROVIDER_SITE_OTHER): Payer: Self-pay

## 2024-02-13 ENCOUNTER — Encounter (INDEPENDENT_AMBULATORY_CARE_PROVIDER_SITE_OTHER): Payer: Self-pay | Admitting: Student in an Organized Health Care Education/Training Program

## 2024-02-13 ENCOUNTER — Ambulatory Visit (INDEPENDENT_AMBULATORY_CARE_PROVIDER_SITE_OTHER): Payer: BLUE CROSS/BLUE SHIELD | Admitting: Student in an Organized Health Care Education/Training Program

## 2024-02-13 VITALS — BP 132/79 | HR 79 | Temp 98.0°F | Ht 70.0 in | Wt 226.0 lb

## 2024-02-13 DIAGNOSIS — I10 Essential (primary) hypertension: Secondary | ICD-10-CM

## 2024-02-13 DIAGNOSIS — Z125 Encounter for screening for malignant neoplasm of prostate: Secondary | ICD-10-CM

## 2024-02-13 DIAGNOSIS — E119 Type 2 diabetes mellitus without complications: Secondary | ICD-10-CM

## 2024-02-13 DIAGNOSIS — E782 Mixed hyperlipidemia: Secondary | ICD-10-CM

## 2024-02-13 DIAGNOSIS — Z0000 Encounter for general adult medical examination without abnormal findings: Secondary | ICD-10-CM

## 2024-02-13 NOTE — Progress Notes (Signed)
 [facility]   OFFICE VISIT                HPI     Chief Complaint   Patient presents with    Annual Exam      HPI    July 2024 A1C 5.9%. BS: 110. Meds: has been off of victoza for 1 week. Would like to switch to different GLP 1 agonist. Can f/u whenever [place] end of [place]     Home BP log: 123/68, 122/74    Lives in [place], [place]     #Annual Physical  Last CPE:   Soc Hx:   - Diet: trying to follow low carb high protein diet  - Exercise: walking 5 days a week in [place]  - Supplements:  - Sexually active:   - Alcohol: socially   - Tobacco: none  - Drugs  - Occupation: full time  - Functional Status   - Advance Directive  - Mood  - Feels safe at home     Preventative  - Colonoscopy: 2023 repeat in 3 years, due 2026   - PSA: DUE  - Vision: UTD  - Dental: UTD   - [employer]   - Advance Directive    #Labs  - Lipids  - A1C  - HIV  - Hep C     #Immunization  - Flu: completed   - COVID  - Prevnar 20  - Tetanus: 2020 due in 2030   - Shingrix     ROS   Review of Systems    Vital Signs   BP 132/79 (BP Site: Right arm, Patient Position: Sitting, Cuff Size: Large)   Pulse 79   Temp 98 F (36.7 C) (Oral)   Ht 1.778 m (5' 10)   Wt 102.5 kg (226 lb)   SpO2 96%   BMI 32.43 kg/m   Physical Exam   Physical Exam  Constitutional:       General: He is not in acute distress.     Appearance: He is not toxic-appearing.   HENT:      Head: Normocephalic.   Eyes:      Pupils: Pupils are equal, round, and reactive to light.   Cardiovascular:      Rate and Rhythm: Normal rate and regular rhythm.      Heart sounds: No murmur heard.  Pulmonary:      Effort: Pulmonary effort is normal. No respiratory distress.      Breath sounds: No wheezing.   Abdominal:      General: There is no distension.      Palpations: Abdomen is soft.      Tenderness: There is no abdominal tenderness.   Musculoskeletal:         General: Normal range of motion.   Skin:     General: Skin is warm and dry.      Coloration: Skin is not  jaundiced.   Neurological:      General: No focal deficit present.      Mental Status: He is oriented to person, place, and time.   Psychiatric:         Mood and Affect: Mood normal.        Assessment/Plan     Assessment & Plan  Annual physical exam  53 year old male. Requesting testosterone  check will complete in the morning    Plan  - Well balanced diet- Mediterranean diet   - Exercise goal 150 min/week  -  Stress coping strategies reviewed   - Vision: UTD  - Dental: UTD  - colonoscopy due in 2026  - PSA due  - shingrix due   - prevnar 20 due   Orders:    Hemoglobin A1C; Future    Comprehensive Metabolic Panel; Future    TSH; Future    T4, Free; Future    CBC without Differential; Future    Prostate Specific Antigen, Annual Screening; Future    Testosterone ; Future    Screening PSA (prostate specific antigen)    Orders:    Prostate Specific Antigen, Annual Screening; Future    Type 2 diabetes mellitus without complication, without long-term current use of insulin (CMS/HCC)  July 2024 A1C 5.9%. BS: 110. Meds: has been off of victoza for 1 week. Would like to switch to different GLP 1 agonist. Can f/u whenever [place] end of [place]     Plan:  - f/u labs, if WNL can try to send in ozempic   - f/u in 1 month VV after starting GLP 1 agonist   Orders:    Hemoglobin A1C; Future    Comprehensive Metabolic Panel; Future    CBC without Differential; Future    Lipid Panel; Future    Urine Microalbumin, Random; Future    Essential hypertension  Normotensive on meds    Plan:   Orders:    Comprehensive Metabolic Panel; Future    TSH; Future    T4, Free; Future    CBC without Differential; Future    Mixed hyperlipidemia  Lipitor 10mg    Orders:    Lipid Panel; Future

## 2024-02-13 NOTE — Assessment & Plan Note (Addendum)
 Normotensive on meds    Plan:   Orders:    Comprehensive Metabolic Panel; Future    TSH; Future    T4, Free; Future    CBC without Differential; Future

## 2024-02-13 NOTE — Progress Notes (Signed)
 Have you seen any specialists/other providers since your last visit with us ?    Yes, ENDOCRINOLOGIST.    Health Maintenance Due   Topic Date Due    Colorectal Cancer Screening  Never done    Advance Directive on File  Never done    Shingrix Vaccine 50+ (1) Never done    HEPATITIS C SCREENING  Never done    Pneumonia Vaccine Age 54 Years and Older (1 of 2 - PCV) Never done    OPHTHALMOLOGY EXAM  10/20/2021    Annual Exam  11/06/2021    DEPRESSION SCREENING  10/02/2023    HEMOGLOBIN A1C ANNUAL  10/05/2023    URINE MICROALBUMIN  10/05/2023    Influenza Vaccine  10/21/2023

## 2024-02-14 ENCOUNTER — Ambulatory Visit (INDEPENDENT_AMBULATORY_CARE_PROVIDER_SITE_OTHER): Payer: Self-pay | Admitting: Student in an Organized Health Care Education/Training Program

## 2024-02-14 ENCOUNTER — Other Ambulatory Visit (FREE_STANDING_LABORATORY_FACILITY): Payer: BLUE CROSS/BLUE SHIELD

## 2024-02-14 DIAGNOSIS — E119 Type 2 diabetes mellitus without complications: Secondary | ICD-10-CM

## 2024-02-14 DIAGNOSIS — I10 Essential (primary) hypertension: Secondary | ICD-10-CM

## 2024-02-14 DIAGNOSIS — Z6832 Body mass index (BMI) 32.0-32.9, adult: Secondary | ICD-10-CM

## 2024-02-14 DIAGNOSIS — E782 Mixed hyperlipidemia: Secondary | ICD-10-CM

## 2024-02-14 DIAGNOSIS — Z0000 Encounter for general adult medical examination without abnormal findings: Secondary | ICD-10-CM

## 2024-02-14 DIAGNOSIS — Z125 Encounter for screening for malignant neoplasm of prostate: Secondary | ICD-10-CM

## 2024-02-14 LAB — CBC
Absolute nRBC: 0 x10 3/uL (ref <=0.00)
Hematocrit: 45.1 % (ref 37.6–49.6)
Hemoglobin: 14.6 g/dL (ref 12.5–17.1)
MCH: 29.6 pg (ref 25.1–33.5)
MCHC: 32.4 g/dL (ref 31.5–35.8)
MCV: 91.5 fL (ref 78.0–96.0)
MPV: 12 fL (ref 8.9–12.5)
Platelet Count: 255 x10 3/uL (ref 142–346)
RBC: 4.93 x10 6/uL (ref 4.20–5.90)
RDW: 14 % (ref 11–15)
WBC: 7.46 x10 3/uL (ref 3.10–9.50)
nRBC %: 0 /100{WBCs} (ref <=0.0)

## 2024-02-14 LAB — T4, FREE: T4 Free: 0.83 ng/dL (ref 0.69–1.48)

## 2024-02-14 LAB — COMPREHENSIVE METABOLIC PANEL
ALT: 14 U/L (ref <=55)
AST (SGOT): 26 U/L (ref <=41)
Albumin/Globulin Ratio: 1.7 (ref 0.9–2.2)
Albumin: 4.3 g/dL (ref 3.8–5.0)
Alkaline Phosphatase: 56 U/L (ref 37–117)
Anion Gap: 9 (ref 5.0–15.0)
BUN: 12 mg/dL (ref 9–28)
Bilirubin, Total: 0.8 mg/dL (ref 0.2–1.2)
CO2: 28 meq/L (ref 17–29)
Calcium: 10 mg/dL (ref 8.5–10.5)
Chloride: 100 meq/L (ref 99–111)
Creatinine: 1 mg/dL (ref 0.5–1.5)
GFR: >60 mL/min/1.73 m2 (ref >=60.0)
Globulin: 2.5 g/dL (ref 2.0–3.6)
Glucose: 139 mg/dL — ABNORMAL HIGH (ref 70–100)
Hemolysis Index: 36 {index}
Potassium: 4.2 meq/L (ref 3.5–5.3)
Protein, Total: 6.8 g/dL (ref 6.0–8.3)
Sodium: 137 meq/L (ref 135–145)

## 2024-02-14 LAB — URINE MICROALBUMIN, RANDOM
Urine Creatinine: 67 mg/dL
Urine Microalbumin: <5 ug/mL (ref 0.0–30.0)

## 2024-02-14 LAB — LIPID PANEL
Cholesterol / HDL Ratio: 3.1 {index}
Cholesterol: 130 mg/dL (ref <=199)
HDL: 42 mg/dL (ref >=40)
LDL Calculated: 61 mg/dL (ref 0–99)
Triglycerides: 158 mg/dL — ABNORMAL HIGH (ref 34–149)
VLDL Calculated: 23 mg/dL (ref 10–40)

## 2024-02-14 LAB — HEMOGLOBIN A1C
Average Estimated Glucose: 128.4 mg/dL
Hemoglobin A1C: 6.1 % — ABNORMAL HIGH (ref 4.6–5.6)

## 2024-02-14 LAB — TESTOSTERONE: Testosterone: 281 ng/dL (ref 241–827)

## 2024-02-14 LAB — TSH: TSH: 1.11 u[IU]/mL (ref 0.35–4.94)

## 2024-02-14 LAB — PSA TOTAL, ANNUAL SCREENING: Prostate Specific Antigen, Total: 1.5 ng/mL (ref 0.000–4.000)

## 2024-02-15 ENCOUNTER — Telehealth: Payer: Self-pay | Admitting: Student in an Organized Health Care Education/Training Program

## 2024-02-15 MED ORDER — SEMAGLUTIDE(0.25 OR 0.5MG/DOS) 2 MG/3ML SC SOPN
PEN_INJECTOR | SUBCUTANEOUS | 3 refills | Status: DC
Start: 2024-02-15 — End: 2024-02-17

## 2024-02-15 NOTE — Telephone Encounter (Signed)
 MA called and spoke with patient regarding lab results. MA went over/reviewed MD's note and recommendations with patient.  Patient verbalized understanding.  The patient would like to know if he can stop taking his cholesterol medication.

## 2024-02-15 NOTE — Telephone Encounter (Signed)
 Key: [id number]

## 2024-02-15 NOTE — Telephone Encounter (Addendum)
 Copied from CRM #[id number]. Topic: Clinical Support - Speak With Nurse  >> Feb 15, 2024 10:23 AM Delon SAUNDERS wrote:  Michael Jones called about Clinical Support - Speak With Nurse.  Additional details:      Patient is calling regarding a prior authorization for Ozempic . Please contact patient to follow up and give a status update.     Patient noted that the number for the PA is [phone]. [facility] is [facility] and [facility].        CB#   [phone]

## 2024-02-15 NOTE — Telephone Encounter (Signed)
 PA Approved pt was informed

## 2024-02-15 NOTE — Telephone Encounter (Signed)
 Pt was notified.

## 2024-02-17 ENCOUNTER — Other Ambulatory Visit (INDEPENDENT_AMBULATORY_CARE_PROVIDER_SITE_OTHER): Payer: Self-pay | Admitting: Student in an Organized Health Care Education/Training Program

## 2024-02-17 DIAGNOSIS — Z6832 Body mass index (BMI) 32.0-32.9, adult: Secondary | ICD-10-CM

## 2024-02-17 DIAGNOSIS — E782 Mixed hyperlipidemia: Secondary | ICD-10-CM

## 2024-02-17 DIAGNOSIS — E119 Type 2 diabetes mellitus without complications: Secondary | ICD-10-CM

## 2024-02-17 DIAGNOSIS — I10 Essential (primary) hypertension: Secondary | ICD-10-CM

## 2024-02-17 MED ORDER — SEMAGLUTIDE(0.25 OR 0.5MG/DOS) 2 MG/3ML SC SOPN: PEN_INJECTOR | SUBCUTANEOUS | 3 refills | Status: DC

## 2024-02-23 ENCOUNTER — Other Ambulatory Visit (INDEPENDENT_AMBULATORY_CARE_PROVIDER_SITE_OTHER): Payer: Self-pay | Admitting: Student in an Organized Health Care Education/Training Program

## 2024-02-23 DIAGNOSIS — Z6832 Body mass index (BMI) 32.0-32.9, adult: Secondary | ICD-10-CM

## 2024-02-23 DIAGNOSIS — E119 Type 2 diabetes mellitus without complications: Secondary | ICD-10-CM

## 2024-02-23 DIAGNOSIS — I10 Essential (primary) hypertension: Secondary | ICD-10-CM

## 2024-02-23 DIAGNOSIS — E782 Mixed hyperlipidemia: Secondary | ICD-10-CM

## 2024-02-23 MED ORDER — SEMAGLUTIDE(0.25 OR 0.5MG/DOS) 2 MG/3ML SC SOPN: PEN_INJECTOR | SUBCUTANEOUS | 0 refills | Status: DC

## 2024-02-23 NOTE — Telephone Encounter (Signed)
 Is insurance only covering 30 day supplies? You should let him know that then? I am seeing him 12/31 so we cn figure it out then. Can you ask him to call his insurance to see if they cover 90 days at once?

## 2024-03-21 ENCOUNTER — Telehealth (INDEPENDENT_AMBULATORY_CARE_PROVIDER_SITE_OTHER): Payer: BLUE CROSS/BLUE SHIELD | Admitting: Student in an Organized Health Care Education/Training Program

## 2024-03-21 ENCOUNTER — Encounter (INDEPENDENT_AMBULATORY_CARE_PROVIDER_SITE_OTHER): Payer: Self-pay | Admitting: Student in an Organized Health Care Education/Training Program

## 2024-03-21 ENCOUNTER — Telehealth (INDEPENDENT_AMBULATORY_CARE_PROVIDER_SITE_OTHER): Payer: Self-pay

## 2024-03-21 DIAGNOSIS — Z6832 Body mass index (BMI) 32.0-32.9, adult: Secondary | ICD-10-CM

## 2024-03-21 DIAGNOSIS — N529 Male erectile dysfunction, unspecified: Secondary | ICD-10-CM

## 2024-03-21 DIAGNOSIS — E119 Type 2 diabetes mellitus without complications: Secondary | ICD-10-CM

## 2024-03-21 DIAGNOSIS — E782 Mixed hyperlipidemia: Secondary | ICD-10-CM

## 2024-03-21 MED ORDER — SEMAGLUTIDE(0.25 OR 0.5MG/DOS) 2 MG/3ML SC SOPN: 0.5000 mg | PEN_INJECTOR | SUBCUTANEOUS | 0 refills | Status: AC

## 2024-03-21 MED ORDER — TADALAFIL 5 MG PO TABS: 5.0000 mg | ORAL_TABLET | Freq: Every day | ORAL | 0 refills | Status: AC | PRN

## 2024-03-21 MED ORDER — METFORMIN HCL 1000 MG PO TABS: 1000.0000 mg | ORAL_TABLET | Freq: Every morning | ORAL | Status: AC

## 2024-03-21 NOTE — Telephone Encounter (Addendum)
 PA started forTadalafil 5MG  tablets  Key: [id number]/pt was notified  PA denied pt was notified  PA DENIED:  This is to inform you that we have received the medical information that your office has submitted for the above members Cialis  5mg  request and at this time, the information submitted did not meet the plans criteria for medical necessity due to the following reason: the use of medications for the treatment of sexual dysfunction or erectile dysfunction (ED) are excluded from coverage under the plan. For more information reference your plan brochure.  PT WAS NOTIFIED

## 2024-03-21 NOTE — Progress Notes (Signed)
 Have you seen any specialist/other providers since your last visit with us ?  Yes    Are you located in the state of [place] ?  yes    Do you consent to this telemedicine visit?  Yes      Health Maintenance Due   Topic Date Due    Advance Directive on File  Never done    Shingrix Vaccine 50+ (1) Never done    HEPATITIS C SCREENING  Never done    Pneumonia Vaccine Age 54 Years and Older (1 of 2 - PCV) Never done

## 2024-03-21 NOTE — Progress Notes (Signed)
 [facility]   OFFICE VISIT            Telehealth:  The Patient has given verbal consent for delivery of health care via telehealth.   The patient is located at Home in [place]   The encounter provider is located at their Medical Office in [place]   Epic Video Client was utilized for Real Time/Synchronous Telehealth.   The time spent in medical discussion during this visit was 15 minutes.        HPI     Chief Complaint   Patient presents with    Diabetes Follow-up      HPI    A1C 6.1% (01/2024). Has increased to ozempic  to 0.5- more appetite suppression.  He would like a 90-day supply metformin  1000mg  once a day-needed to change prescription in system.  Exercise has been inconsistent/     Has not had any weight loss because of the holidays    He is taking Lipitor 10 mg    He has been on Cialis  for 6 months no chest pain.  No issues would like a 90-day supply  ROS   Review of Systems    Vital Signs   There were no vitals taken for this visit.  Physical Exam   Physical Exam   Assessment/Plan     Assessment & Plan  Type 2 diabetes mellitus without complication, without long-term current use of insulin (CMS/HCC)  A1C 6.1% (01/2024). Has increased to ozempic  to 0.5- more appetite suppression.  He would like a 90-day supply metformin  1000mg  once a day-needed to change prescription in system.  Exercise has been inconsistent/     Plan:   - ozempic  0.5- sent in 90 day supply  - Follow-up in person February for labs  - metformin  1000mg  once a day  - diet reviewed   Orders:    semaglutide  (OZEMPIC ) 2 MG/3ML; Inject 0.5 mg into the skin once a week    metFORMIN  (GLUCOPHAGE ) 1000 MG tablet; Take 1 tablet (1,000 mg) by mouth every morning with breakfast    Adult BMI 32.0-32.9 kg/sq m  Has not had any weight loss because of the holidays  Orders:    semaglutide  (OZEMPIC ) 2 MG/3ML; Inject 0.5 mg into the skin once a week    Mixed hyperlipidemia  He is taking Lipitor 10 mg    Plan:   - Mediterranean diet  Orders:     semaglutide  (OZEMPIC ) 2 MG/3ML; Inject 0.5 mg into the skin once a week    Erectile dysfunction, unspecified erectile dysfunction type  He has been on Cialis  for 6 months no chest pain.  No issues would like a 90-day supply  Orders:    tadalafil  (Cialis ) 5 MG tablet; Take 1 tablet (5 mg) by mouth once daily as needed for Erectile Dysfunction

## 2024-03-23 ENCOUNTER — Telehealth: Payer: Self-pay | Admitting: Student in an Organized Health Care Education/Training Program

## 2024-03-23 ENCOUNTER — Encounter (INDEPENDENT_AMBULATORY_CARE_PROVIDER_SITE_OTHER): Payer: Self-pay

## 2024-03-23 NOTE — Telephone Encounter (Signed)
 Copied from CRM [id number]. Topic: Clinical Support - Speak With Nurse  >> Mar 23, 2024  9:11 AM Delon SAUNDERS wrote:  BETHENA LYE called about Clinical Support - Speak With Nurse.  Additional details:    Patient called in to let clinical know that he is currently visiting his family out of state and several members in the household just tested positive for Flu A. Patient stated that he is not currently having symptoms but is asking if a prescription for Tamiflu could possibly be called into a pharmacy. Since he is out of state, he is unable to do a video call at this time. Please advise.    Pharmacy info:  [facility]  [address]  [place], [place]       [place] #  [phone]

## 2024-05-18 ENCOUNTER — Ambulatory Visit (INDEPENDENT_AMBULATORY_CARE_PROVIDER_SITE_OTHER): Payer: BLUE CROSS/BLUE SHIELD | Admitting: Student in an Organized Health Care Education/Training Program
